# Patient Record
Sex: Male | Born: 1965 | Race: White | Hispanic: No | Marital: Married | Smoking: Current every day smoker
Health system: Southern US, Community
[De-identification: ages and names within clinical notes are randomized; demographics above are authoritative.]

## PROBLEM LIST (undated history)

## (undated) DIAGNOSIS — F32A Depression, unspecified: Secondary | ICD-10-CM

## (undated) DIAGNOSIS — F419 Anxiety disorder, unspecified: Secondary | ICD-10-CM

## (undated) DIAGNOSIS — F329 Major depressive disorder, single episode, unspecified: Secondary | ICD-10-CM

## (undated) DIAGNOSIS — G4730 Sleep apnea, unspecified: Secondary | ICD-10-CM

## (undated) DIAGNOSIS — I10 Essential (primary) hypertension: Secondary | ICD-10-CM

## (undated) DIAGNOSIS — H3552 Pigmentary retinal dystrophy: Secondary | ICD-10-CM

## (undated) DIAGNOSIS — H269 Unspecified cataract: Secondary | ICD-10-CM

## (undated) DIAGNOSIS — E7800 Pure hypercholesterolemia, unspecified: Secondary | ICD-10-CM

## (undated) HISTORY — DX: Unspecified cataract: H26.9

## (undated) HISTORY — DX: Sleep apnea, unspecified: G47.30

## (undated) HISTORY — DX: Pure hypercholesterolemia, unspecified: E78.00

## (undated) HISTORY — DX: Depression, unspecified: F32.A

## (undated) HISTORY — PX: EYE SURGERY: SHX253

## (undated) HISTORY — DX: Anxiety disorder, unspecified: F41.9

## (undated) HISTORY — DX: Essential (primary) hypertension: I10

## (undated) HISTORY — DX: Pigmentary retinal dystrophy: H35.52

## (undated) HISTORY — DX: Major depressive disorder, single episode, unspecified: F32.9

---

## 1999-01-28 ENCOUNTER — Emergency Department (HOSPITAL_COMMUNITY): Admission: EM | Admit: 1999-01-28 | Discharge: 1999-01-28 | Payer: Self-pay | Admitting: Internal Medicine

## 1999-10-18 ENCOUNTER — Encounter: Admission: RE | Admit: 1999-10-18 | Discharge: 1999-10-18 | Payer: Self-pay | Admitting: Otolaryngology

## 1999-10-18 ENCOUNTER — Encounter: Payer: Self-pay | Admitting: Otolaryngology

## 2000-10-20 ENCOUNTER — Emergency Department (HOSPITAL_COMMUNITY): Admission: EM | Admit: 2000-10-20 | Discharge: 2000-10-20 | Payer: Self-pay | Admitting: Internal Medicine

## 2001-12-01 ENCOUNTER — Emergency Department (HOSPITAL_COMMUNITY): Admission: EM | Admit: 2001-12-01 | Discharge: 2001-12-01 | Payer: Self-pay | Admitting: Emergency Medicine

## 2008-06-17 HISTORY — PX: HAND SURGERY: SHX662

## 2008-08-01 ENCOUNTER — Ambulatory Visit (HOSPITAL_COMMUNITY): Admission: EM | Admit: 2008-08-01 | Discharge: 2008-08-01 | Payer: Self-pay | Admitting: Emergency Medicine

## 2010-10-02 LAB — CBC
HCT: 43.6 % (ref 39.0–52.0)
Hemoglobin: 14.7 g/dL (ref 13.0–17.0)
MCHC: 33.8 g/dL (ref 30.0–36.0)
MCV: 91.6 fL (ref 78.0–100.0)
Platelets: 304 10*3/uL (ref 150–400)
RBC: 4.76 MIL/uL (ref 4.22–5.81)
RDW: 13.2 % (ref 11.5–15.5)
WBC: 13.6 10*3/uL — ABNORMAL HIGH (ref 4.0–10.5)

## 2010-10-02 LAB — COMPREHENSIVE METABOLIC PANEL
ALT: 18 U/L (ref 0–53)
AST: 19 U/L (ref 0–37)
Albumin: 3.5 g/dL (ref 3.5–5.2)
Alkaline Phosphatase: 76 U/L (ref 39–117)
BUN: 8 mg/dL (ref 6–23)
CO2: 23 mEq/L (ref 19–32)
Calcium: 8.9 mg/dL (ref 8.4–10.5)
Chloride: 108 mEq/L (ref 96–112)
Creatinine, Ser: 0.88 mg/dL (ref 0.4–1.5)
GFR calc Af Amer: >60 mL/min (ref >60)
GFR calc non Af Amer: >60 mL/min (ref >60)
Glucose, Bld: 120 mg/dL — ABNORMAL HIGH (ref 70–99)
Potassium: 3.6 mEq/L (ref 3.5–5.1)
Sodium: 140 mEq/L (ref 135–145)
Total Bilirubin: 0.7 mg/dL (ref 0.3–1.2)
Total Protein: 6.3 g/dL (ref 6.0–8.3)

## 2010-10-02 LAB — DIFFERENTIAL
Eosinophils Relative: 3 % (ref 0–5)
Lymphocytes Relative: 35 % (ref 12–46)
Lymphs Abs: 4.8 10*3/uL — ABNORMAL HIGH (ref 0.7–4.0)
Neutrophils Relative %: 54 % (ref 43–77)

## 2010-10-30 NOTE — Consult Note (Signed)
NAME:  HOBSON, LAX NO.:  [id number]   MEDICAL RECORD NO.:  [id number]          PATIENT TYPE:  EMS   LOCATION:  ED                           FACILITY:  [facility]   PHYSICIAN:  Artist Pais. Weingold, M.D.DATE OF BIRTH:  [birth year]   DATE OF CONSULTATION:  08/01/2008  DATE OF DISCHARGE:                                 CONSULTATION   PHYSICIAN REQUESTING CONSULTATION:  Rhae Lerner. Margretta Ditty, M.D.   REASON FOR CONSULTATION:  Ihsan Nomura is a 45 year old right-hand-  dominant male who unfortunately got his nondominant left hand caught in  a machine at work.  Presents today with complex laceration to his left  hand.  He is 45 years old.  He has no known drug allergies.  No current  medications.  No recent hospitalizations or surgery.   FAMILY MEDICAL HISTORY:  Noncontributory.   SOCIAL HISTORY:  Occasional alcohol and tobacco use.   PHYSICAL EXAMINATION:  GENERAL:  A well-developed, well-nourished male,  pleasant, alert and oriented x3.  EXAMINATION OF HIS HAND ON THE LEFT:  He has a complex laceration on the  volar aspect of his hand going from the index finger MP joint to the  ring finger MCP joint along the palmar creases.  He has the ability to  flex his fingers although it is painful.  He also has a laceration along  the radial side of the thumb from the nail bed and nail plate  intersection to the level just proximal to the IP joint and 2 large  dorsal lacerations over the long and ring metacarpals.  X-rays are  negative for fracture, dislocation.  He does have a fair amount of  swelling and bleeding.   ASSESSMENT:  A 45 year old male with complex lacerations, nondominant  left hand, status post industrial accident.   RECOMMENDATIONS:  At this point in time, we will take him to the  operating room for I and D, explore and repair as necessary complex  lacerations with possible tendon artery nerve damage left hand.      Artist Pais Mina Marble, M.D.  Electronically Signed     MAW/MEDQ  D:  08/01/2008  T:  08/02/2008  Job:  [id number]

## 2010-10-30 NOTE — Op Note (Signed)
NAME:  Manuel Lang, Manuel Lang NO.:  [id number]   MEDICAL RECORD NO.:  [id number]          PATIENT TYPE:  EMS   LOCATION:  ED                           FACILITY:  [facility]   PHYSICIAN:  Artist Pais. Weingold, M.D.DATE OF BIRTH:  Oct 07, 1965   DATE OF PROCEDURE:  08/01/2008  DATE OF DISCHARGE:                               OPERATIVE REPORT   PREOPERATIVE DIAGNOSIS:  Complex lacerations, left hand.   POSTOPERATIVE DIAGNOSIS:  Complex lacerations, left hand.   PROCEDURE:  Irrigation, debridement, exploration and repair of complex  laceration left hand.   SURGEON:  Artist Pais. Mina Marble, M.D.   ASSISTANT:  None.   ANESTHESIA:  General.   TOURNIQUET TIME:  38 minutes.   COMPLICATIONS:  None.   DRAINS:  None.   OPERATIVE REPORT:  The patient was taken to operating suite.  After  induction of adequate general anesthesia, left upper extremity was  prepped and draped in the usual sterile fashion.  Esmarch was used to  exsanguinate the limb.  Tourniquet was then inflated to 250 mmHg.  At  this point in time, exploration revealed a complex laceration from the  index finger metacarpophalangeal joint to the level of the ring finger  metacarpophalangeal joint volarly along the MP flexion creases, also a  complex laceration to the radial aspect of the thumb IP joint and two  large dorsal lacerations.  Exploration of the dorsal laceration revealed  intact tendon units, significant blood and soiling.  This was carefully  irrigated and debrided of all nonviable tissue then loosely closed with  3-0 and 4-0 Vicryl Rapide suture.  The hand was then fully supinated.  The radial side thumb laceration revealed intact radial neurovascular  bundle and intact flexor sheath.  This was also irrigated and loosely  closed with 4-0 Vicryl Rapide.  The palmar laceration was explored.  The  digital nerves to the index, long and ring finger were carefully  identified and deemed be intact.  There was  some stretching of the soft  tissues and tendons were intact as well as arteries and nerves.  These  wounds were irrigated and loosely closed with 4-0 Vicryl Rapide suture.  At the end of procedure, Xeroform, 4x4s and compressive wrap was applied  as well as a dorsal splint.  The patient tolerated well and went to  recovery room in stable fashion.     Artist Pais Mina Marble, M.D.  Electronically Signed    MAW/MEDQ  D:  08/01/2008  T:  08/02/2008  Job:  [id number]

## 2013-04-04 ENCOUNTER — Ambulatory Visit (HOSPITAL_COMMUNITY)
Admission: RE | Admit: 2013-04-04 | Discharge: 2013-04-04 | Disposition: A | Discharge status: Home / Self Care | Payer: BC Managed Care – PPO | Source: Ambulatory Visit | Attending: Emergency Medicine | Admitting: Emergency Medicine

## 2013-04-04 ENCOUNTER — Ambulatory Visit (INDEPENDENT_AMBULATORY_CARE_PROVIDER_SITE_OTHER): Payer: BC Managed Care – PPO | Admitting: Emergency Medicine

## 2013-04-04 VITALS — BP 124/76 | HR 90 | Temp 98.5°F | Resp 18 | Ht 66.0 in | Wt 168.0 lb

## 2013-04-04 DIAGNOSIS — R51 Headache: Secondary | ICD-10-CM | POA: Insufficient documentation

## 2013-04-04 DIAGNOSIS — R42 Dizziness and giddiness: Secondary | ICD-10-CM | POA: Insufficient documentation

## 2013-04-04 DIAGNOSIS — S0990XA Unspecified injury of head, initial encounter: Secondary | ICD-10-CM

## 2013-04-04 DIAGNOSIS — R270 Ataxia, unspecified: Secondary | ICD-10-CM

## 2013-04-04 DIAGNOSIS — Z9181 History of falling: Secondary | ICD-10-CM | POA: Insufficient documentation

## 2013-04-04 DIAGNOSIS — R279 Unspecified lack of coordination: Secondary | ICD-10-CM

## 2013-04-04 LAB — COMPREHENSIVE METABOLIC PANEL WITH GFR
ALT: 24 U/L (ref 0–53)
AST: 17 U/L (ref 0–37)
Albumin: 4.1 g/dL (ref 3.5–5.2)
Alkaline Phosphatase: 79 U/L (ref 39–117)
BUN: 10 mg/dL (ref 6–23)
CO2: 25 meq/L (ref 19–32)
Calcium: 9.6 mg/dL (ref 8.4–10.5)
Chloride: 108 meq/L (ref 96–112)
Creat: 0.84 mg/dL (ref 0.50–1.35)
Glucose, Bld: 94 mg/dL (ref 70–99)
Potassium: 4.2 meq/L (ref 3.5–5.3)
Sodium: 140 meq/L (ref 135–145)
Total Bilirubin: 0.4 mg/dL (ref 0.3–1.2)
Total Protein: 7.3 g/dL (ref 6.0–8.3)

## 2013-04-04 LAB — POCT CBC
Granulocyte percent: 69.4 % (ref 37–80)
HCT, POC: 51.3 % (ref 43.5–53.7)
Hemoglobin: 16.5 g/dL (ref 14.1–18.1)
Lymph, poc: 2.9 (ref 0.6–3.4)
MCH, POC: 30.7 pg (ref 27–31.2)
MCHC: 32.2 g/dL (ref 31.8–35.4)
MCV: 95.6 fL (ref 80–97)
MID (cbc): 0.8 (ref 0–0.9)
MPV: 8.7 fL (ref 0–99.8)
POC Granulocyte: 8.5 — AB (ref 2–6.9)
POC LYMPH PERCENT: 24 % (ref 10–50)
POC MID %: 6.6 % (ref 0–12)
Platelet Count, POC: 353 K/uL (ref 142–424)
RBC: 5.37 M/uL (ref 4.69–6.13)
RDW, POC: 14 %
WBC: 12.2 K/uL — AB (ref 4.6–10.2)

## 2013-04-04 MED ORDER — MECLIZINE HCL 50 MG PO TABS: 50.0000 mg | ORAL_TABLET | Freq: Three times a day (TID) | ORAL | Status: DC | PRN
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
## 2013-04-05 ENCOUNTER — Other Ambulatory Visit (HOSPITAL_COMMUNITY): Payer: Self-pay

## 2013-04-08 ENCOUNTER — Ambulatory Visit (INDEPENDENT_AMBULATORY_CARE_PROVIDER_SITE_OTHER): Payer: BC Managed Care – PPO | Admitting: Neurology

## 2013-04-08 ENCOUNTER — Encounter (INDEPENDENT_AMBULATORY_CARE_PROVIDER_SITE_OTHER): Payer: Self-pay

## 2013-04-08 ENCOUNTER — Encounter: Payer: Self-pay | Admitting: Neurology

## 2013-04-08 VITALS — BP 128/93 | HR 105 | Ht 66.0 in | Wt 167.0 lb

## 2013-04-08 DIAGNOSIS — F32A Depression, unspecified: Secondary | ICD-10-CM | POA: Insufficient documentation

## 2013-04-08 DIAGNOSIS — F329 Major depressive disorder, single episode, unspecified: Secondary | ICD-10-CM

## 2013-04-08 DIAGNOSIS — R42 Dizziness and giddiness: Secondary | ICD-10-CM | POA: Insufficient documentation

## 2013-04-08 MED ORDER — SERTRALINE HCL 50 MG PO TABS: 50.0000 mg | ORAL_TABLET | Freq: Every day | ORAL | Status: DC
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
## 2013-05-21 ENCOUNTER — Encounter: Payer: Self-pay | Admitting: Nurse Practitioner

## 2013-05-24 ENCOUNTER — Encounter: Payer: Self-pay | Admitting: Nurse Practitioner

## 2013-07-09 ENCOUNTER — Ambulatory Visit: Payer: BC Managed Care – PPO | Admitting: Nurse Practitioner

## 2013-11-09 ENCOUNTER — Ambulatory Visit (INDEPENDENT_AMBULATORY_CARE_PROVIDER_SITE_OTHER): Payer: BC Managed Care – PPO | Admitting: Internal Medicine

## 2013-11-09 ENCOUNTER — Ambulatory Visit: Payer: BC Managed Care – PPO

## 2013-11-09 VITALS — BP 140/88 | HR 66 | Temp 97.8°F | Resp 16 | Ht 65.0 in | Wt 169.2 lb

## 2013-11-09 DIAGNOSIS — L709 Acne, unspecified: Secondary | ICD-10-CM | POA: Insufficient documentation

## 2013-11-09 DIAGNOSIS — N200 Calculus of kidney: Secondary | ICD-10-CM

## 2013-11-09 DIAGNOSIS — L708 Other acne: Secondary | ICD-10-CM

## 2013-11-09 DIAGNOSIS — F17200 Nicotine dependence, unspecified, uncomplicated: Secondary | ICD-10-CM | POA: Insufficient documentation

## 2013-11-09 DIAGNOSIS — R319 Hematuria, unspecified: Secondary | ICD-10-CM

## 2013-11-09 DIAGNOSIS — R112 Nausea with vomiting, unspecified: Secondary | ICD-10-CM

## 2013-11-09 DIAGNOSIS — R109 Unspecified abdominal pain: Secondary | ICD-10-CM

## 2013-11-09 LAB — POCT CBC
Granulocyte percent: 84.6 %G — AB (ref 37–80)
HCT, POC: 48.8 % (ref 43.5–53.7)
HEMOGLOBIN: 15.5 g/dL (ref 14.1–18.1)
Lymph, poc: 2.1 (ref 0.6–3.4)
MCH, POC: 29.7 pg (ref 27–31.2)
MCHC: 31.8 g/dL (ref 31.8–35.4)
MCV: 93.4 fL (ref 80–97)
MID (cbc): 0.8 (ref 0–0.9)
POC GRANULOCYTE: 16.3 — AB (ref 2–6.9)
POC LYMPH PERCENT: 11.1 %L (ref 10–50)
POC MID %: 4.3 % (ref 0–12)
Platelet Count, POC: 9.2 10*3/uL — AB (ref 142–424)
RBC: 5.22 M/uL (ref 4.69–6.13)
RDW, POC: 13.9354 %
WBC: 19.3 10*3/uL — AB (ref 4.6–10.2)

## 2013-11-09 LAB — POCT UA - MICROSCOPIC ONLY
CRYSTALS, UR, HPF, POC: NEGATIVE
Casts, Ur, LPF, POC: NEGATIVE
EPITHELIAL CELLS, URINE PER MICROSCOPY: NEGATIVE
Mucus, UA: POSITIVE
YEAST UA: NEGATIVE

## 2013-11-09 LAB — POCT URINALYSIS DIPSTICK
Bilirubin, UA: NEGATIVE
Glucose, UA: NEGATIVE
Ketones, UA: NEGATIVE
NITRITE UA: NEGATIVE
PROTEIN UA: 30
UROBILINOGEN UA: 0.2
pH, UA: 6

## 2013-11-09 MED ORDER — DOXYCYCLINE HYCLATE 100 MG PO TABS: 100.0000 mg | ORAL_TABLET | Freq: Every day | ORAL | Status: DC

## 2013-11-09 MED ORDER — TAMSULOSIN HCL 0.4 MG PO CAPS: 0.4000 mg | ORAL_CAPSULE | Freq: Every day | ORAL | Status: DC

## 2013-11-09 MED ORDER — ONDANSETRON HCL 4 MG PO TABS: 4.0000 mg | ORAL_TABLET | Freq: Three times a day (TID) | ORAL | Status: DC | PRN

## 2013-11-09 MED ORDER — HYDROCODONE-ACETAMINOPHEN 5-325 MG PO TABS: 1.0000 | ORAL_TABLET | Freq: Four times a day (QID) | ORAL | Status: DC | PRN

## 2013-11-09 MED ORDER — ONDANSETRON 4 MG PO TBDP
4.0000 mg | ORAL_TABLET | Freq: Once | ORAL | Status: AC
  Administered 2013-11-09: 4 mg via ORAL

## 2013-11-09 MED ORDER — KETOROLAC TROMETHAMINE 60 MG/2ML IM SOLN
60.0000 mg | Freq: Once | INTRAMUSCULAR | Status: AC
  Administered 2013-11-09: 60 mg via INTRAMUSCULAR

## 2013-11-09 NOTE — Progress Notes (Signed)
Subjective:    Patient ID: Manuel Lang, male    DOB: [birth year], 48 y.o.   MRN: [id number]  HPI He has a six-hour history of waxing and waning right flank pain now extending a bit into his right middle quadrant of the abdomen Associated with nausea and vomiting when pain is severe No fever dysuria frequency or urgency and no hematuria No diarrhea No history of kidney stones No current medications  Also has a long history of facial acne and acne on the back usually controlled by doxycycline which he  like a refill   Review of Systems No fever chills or night sweats No chest pain No shortness of breath/he continues to smoke No sleep disorder    Objective:   Physical Exam BP 140/88  Pulse 66  Temp(Src) 97.8 F (36.6 C) (Oral)  Resp 16  Ht 5\' 5"  (1.651 m)  Wt 169 lb 3.2 oz (76.749 kg)  BMI 28.16 kg/m2  SpO2 99% HEENT clear Heart regular Abdomen supple without masses He is tender to percussion over the right flank Extremities are clear Skin reveals folliculitis on the back and facial acne, both mild but persistent  Results for orders placed in visit on 11/09/13  POCT URINALYSIS DIPSTICK      Result Value Ref Range   Color, UA yellow     Clarity, UA clear     Glucose, UA neg     Bilirubin, UA neg     Ketones, UA neg     Spec Grav, UA >=1.030     Blood, UA large     pH, UA 6.0     Protein, UA 30     Urobilinogen, UA 0.2     Nitrite, UA neg     Leukocytes, UA Trace    POCT UA - MICROSCOPIC ONLY      Result Value Ref Range   WBC, Ur, HPF, POC 0-3     RBC, urine, microscopic 15-TNTC     Bacteria, U Microscopic trace     Mucus, UA positive     Epithelial cells, urine per micros neg     Crystals, Ur, HPF, POC neg     Casts, Ur, LPF, POC neg     Yeast, UA neg    POCT CBC      Result Value Ref Range   WBC 19.3 (*) 4.6 - 10.2 K/uL   Lymph, poc 2.1  0.6 - 3.4   POC LYMPH PERCENT 11.1  10 - 50 %L   MID (cbc) 0.8  0 - 0.9   POC MID % 4.3  0 - 12 %M   POC  Granulocyte 16.3 (*) 2 - 6.9   Granulocyte percent 84.6 (*) 37 - 80 %G   RBC 5.22  4.69 - 6.13 M/uL   Hemoglobin 15.5  14.1 - 18.1 g/dL   HCT, POC 48.8  43.5 - 53.7 %   MCV 93.4  80 - 97 fL   MCH, POC 29.7  27 - 31.2 pg   MCHC 31.8  31.8 - 35.4 g/dL   RDW, POC 13.9354     Platelet Count, POC 9.2 (*) 142 - 424 K/uL   MPV    0 - 99.8 fL   [facility] reading (PRIMARY) by  Dr.Doolittle=no stone seen Toradol 60 IM given--symptom relief of painful at 25 minutes Nausea responding to Zofran      Assessment & Plan:  Rt flank pain -  Hematuria -  Nausea with vomiting  Nephrolithiasis---likely  diagnosis/first episode   If not resolved in 48 hours call for [place] to set up a CT urogram at urology Meds ordered this encounter  Medications  . ketorolac (TORADOL) injection 60 mg    Sig:   . ondansetron (ZOFRAN-ODT) disintegrating tablet 4 mg    Sig:   . ondansetron (ZOFRAN-ODT) disintegrating tablet 4 mg    Sig:                  . HYDROcodone-acetaminophen (NORCO/VICODIN) 5-325 MG per tablet    Sig: Take 1 tablet by mouth every 6 (six) hours as needed for moderate pain. Kidney stone    Dispense:  30 tablet    Refill:  0  . ondansetron (ZOFRAN) 4 MG tablet    Sig: Take 1 tablet (4 mg total) by mouth every 8 (eight) hours as needed for nausea or vomiting.    Dispense:  12 tablet    Refill:  0  . tamsulosin (FLOMAX) 0.4 MG CAPS capsule    Sig: Take 1 capsule (0.4 mg total) by mouth daily. to increase urinary flow    Dispense:  10 capsule    Refill:  0   Acne . doxycycline (VIBRA-TABS) 100 MG tablet    Sig: Take 1 tablet (100 mg total) by mouth daily. At hs    Dispense:  30 tablet    Refill:  5    Nicotine addiction  2 followup for CPE at 104 in 3 months and discuss this plus other preventive health issues

## 2013-11-10 LAB — COMPREHENSIVE METABOLIC PANEL
ALK PHOS: 69 U/L (ref 39–117)
ALT: 11 U/L (ref 0–53)
AST: 14 U/L (ref 0–37)
Albumin: 4 g/dL (ref 3.5–5.2)
BILIRUBIN TOTAL: 0.4 mg/dL (ref 0.2–1.2)
BUN: 12 mg/dL (ref 6–23)
CO2: 24 meq/L (ref 19–32)
Calcium: 9.5 mg/dL (ref 8.4–10.5)
Chloride: 100 mEq/L (ref 96–112)
Creat: 0.97 mg/dL (ref 0.50–1.35)
Glucose, Bld: 109 mg/dL — ABNORMAL HIGH (ref 70–99)
POTASSIUM: 4.5 meq/L (ref 3.5–5.3)
SODIUM: 132 meq/L — AB (ref 135–145)
TOTAL PROTEIN: 7 g/dL (ref 6.0–8.3)

## 2013-11-22 ENCOUNTER — Encounter: Payer: Self-pay | Admitting: Internal Medicine

## 2014-12-22 ENCOUNTER — Other Ambulatory Visit: Payer: Self-pay | Admitting: Internal Medicine

## 2015-05-25 ENCOUNTER — Ambulatory Visit (INDEPENDENT_AMBULATORY_CARE_PROVIDER_SITE_OTHER): Payer: BLUE CROSS/BLUE SHIELD | Admitting: Family Medicine

## 2015-05-25 VITALS — BP 130/80 | HR 84 | Temp 98.5°F | Resp 16 | Ht 64.0 in | Wt 162.2 lb

## 2015-05-25 DIAGNOSIS — G43109 Migraine with aura, not intractable, without status migrainosus: Secondary | ICD-10-CM | POA: Diagnosis not present

## 2015-05-25 DIAGNOSIS — F411 Generalized anxiety disorder: Secondary | ICD-10-CM | POA: Diagnosis not present

## 2015-05-25 MED ORDER — ALPRAZOLAM 0.25 MG PO TABS: 0.2500 mg | ORAL_TABLET | Freq: Every day | ORAL | Status: DC | PRN

## 2015-05-25 NOTE — Patient Instructions (Signed)

## 2015-05-25 NOTE — Progress Notes (Signed)
This is a 49 year old gentleman who works in Engineer, maintenance (IT). He comes in because of a mild headache on the left side. Headache was preceded by blurry vision lasted about one half hour and he describes as narrowing of visual fields. He's not had this before.  Patient is a smoker. He's had no chest pain or weakness in any of his arms. He's had no speech problem. He was not doing any new activity at the time this occurred. Visual changes have all resolved.  Patient also complains about significant anxiety. In the past she's taken Paxil but was able to wean himself off this 20 years ago. He denies any lack of interest in activities, weepy spells, suicidal ideation, or other symptoms of depression.  Objective: Middle-age man that has good eye contact and is appropriate BP 130/80 mmHg  Pulse 84  Temp(Src) 98.5 F (36.9 C) (Oral)  Resp 16  Ht 5\' 4"  (1.626 m)  Wt 162 lb 3.2 oz (73.573 kg)  BMI 27.83 kg/m2  SpO2 98% HEENT: Normal TMs, normal fundi, bilateral cataracts, edentulous upper plate, lower cavities, clear pharynx Neck: Supple no adenopathy or thyromegaly. No bruits Chest: Clear Heart: Faint 1/6 systolic ejection type murmur, no gallop, regular rhythm Reflexes: Symmetric Muscle strength: Symmetric Extremities: No edema, gait normal  Assessment: This gentleman has some anxiety. He describes a migraine syndrome with aura which was first experienced today.  Plan: Excedrin Migraine should the headache persist or recur. If symptoms become more regular, please return Alprazolam 0.25 one half tablet to 1 tablet every morning when necessary anxiety     ICD-9-CM ICD-10-CM   1. Migraine with aura and without status migrainosus, not intractable 346.00 G43.109 ALPRAZolam (XANAX) 0.25 MG tablet  2. Anxiety state 300.00 F41.1      Signed, Robyn Haber, MD

## 2015-09-10 ENCOUNTER — Ambulatory Visit (INDEPENDENT_AMBULATORY_CARE_PROVIDER_SITE_OTHER): Payer: BLUE CROSS/BLUE SHIELD | Admitting: Family Medicine

## 2015-09-10 VITALS — BP 130/80 | HR 76 | Temp 98.2°F | Resp 16 | Ht 65.0 in | Wt 167.0 lb

## 2015-09-10 DIAGNOSIS — Z7189 Other specified counseling: Secondary | ICD-10-CM | POA: Diagnosis not present

## 2015-09-10 DIAGNOSIS — Z23 Encounter for immunization: Secondary | ICD-10-CM | POA: Diagnosis not present

## 2015-09-10 DIAGNOSIS — Z7185 Encounter for immunization safety counseling: Secondary | ICD-10-CM

## 2015-09-10 DIAGNOSIS — R21 Rash and other nonspecific skin eruption: Secondary | ICD-10-CM | POA: Diagnosis not present

## 2015-09-10 MED ORDER — DOXYCYCLINE HYCLATE 100 MG PO TABS: 100.0000 mg | ORAL_TABLET | Freq: Two times a day (BID) | ORAL | Status: DC

## 2015-09-10 NOTE — Progress Notes (Signed)
By signing my name below, I, [name], attest that this documentation has been prepared under the direction and in the presence of Robyn Haber, MD. Electronically Signed: [name], [place]. 09/10/2015 , 11:41 AM .  Patient was seen in room 12 .   Patient ID: Manuel Lang MRN: [id number], DOB: 25-Feb-1966, 50 y.o. Date of Encounter: 09/10/2015  Primary Physician: Roselee Culver, MD  Chief Complaint:  Chief Complaint  Patient presents with   sores    arm, shoulders, and back of neck around hair line , x 1 year   Immunizations    tdap    HPI:  Manuel Lang is a 50 y.o. male who presents to [facility] complaining of sores over his left arm, shoulders and back of neck. He states that he initially noticed a sore on his left arm about 2 years ago. Now, he's noticed more over time and some on his scalp. He requests to have a tdap vaccine.   He works at [employer].   Past Medical History  Diagnosis Date   Depression      Home Meds: Prior to Admission medications   Medication Sig Start Date End Date Taking? Authorizing Provider  ALPRAZolam (XANAX) 0.25 MG tablet Take 1 tablet (0.25 mg total) by mouth daily as needed for anxiety. 05/25/15  Yes Robyn Haber, MD    Allergies: No Known Allergies  Social History   Social History   Marital Status: Married    Spouse Name: [name]   Number of Children: 3   Years of Education: N/A   Occupational History   Forensic scientist    Social History Main Topics   Smoking status: Current Every Day Smoker   Smokeless tobacco: Never Used   Alcohol Use: No   Drug Use: No   Sexual Activity: Not on file   Other Topics Concern   Not on file   Social History Narrative   Patient lives at home with his wife and children, 1 grandson.   Patient works at [employer], full-time.   Patient is married to [place].   Patient drinks 4-20oz of [facility].   Patient is right-handed.   Patient has 3 children.   Patient has a 7th grade education.     Review of Systems: Constitutional: negative for fever, chills, night sweats, weight changes, or fatigue  HEENT: negative for vision changes, hearing loss, congestion, rhinorrhea, ST, epistaxis, or sinus pressure Cardiovascular: negative for chest pain or palpitations Respiratory: negative for hemoptysis, wheezing, shortness of breath, or cough Abdominal: negative for abdominal pain, nausea, vomiting, diarrhea, or constipation Dermatological: negative for rash; positive for sores  Neurologic: negative for headache, dizziness, or syncope All other systems reviewed and are otherwise negative with the exception to those above and in the HPI.  Physical Exam:  Blood pressure 130/80, pulse 76, temperature 98.2 F (36.8 C), temperature source Oral, resp. rate 16, height 5\' 5"  (1.651 m), weight 167 lb (75.751 kg), SpO2 98 %., Body mass index is 27.79 kg/(m^2). General: Well developed, well nourished, in no acute distress. Head: Normocephalic, atraumatic, eyes without discharge, sclera non-icteric, nares are without discharge. Bilateral auditory canals clear, TM's are without perforation, pearly grey and translucent with reflective cone of light bilaterally. Oral cavity moist, posterior pharynx without exudate, erythema, peritonsillar abscess, or post nasal drip.  Neck: Supple. No thyromegaly. Full ROM. No lymphadenopathy. Lungs: Clear bilaterally to auscultation without wheezes, rales, or rhonchi. Breathing is unlabored. Heart: RRR with S1 S2.  No murmurs, rubs, or gallops appreciated. Msk:  Strength and tone normal for age. Extremities/Skin: Warm and dry. multiple scaly scab lesions over scalp and arms Neuro: Alert and oriented X 3. Moves all extremities spontaneously. Gait is normal. CNII-XII grossly in tact. Psych:  Responds to questions appropriately with a normal affect.   Labs:  ASSESSMENT AND PLAN:  50 y.o. year old male with  Rash and nonspecific skin eruption - Plan: doxycycline (VIBRA-TABS) 100 MG tablet  Immunization counseling - Plan: Tdap vaccine greater than or equal to 7yo IM  This chart was scribed in my presence and reviewed by me personally.     Signed, Robyn Haber, MD 09/10/2015 11:43 AM

## 2015-09-10 NOTE — Patient Instructions (Addendum)
Remember to wash the sheets. Let me know if the rash is not resolving over the next week.

## 2016-06-21 ENCOUNTER — Ambulatory Visit (INDEPENDENT_AMBULATORY_CARE_PROVIDER_SITE_OTHER): Payer: BLUE CROSS/BLUE SHIELD | Admitting: Emergency Medicine

## 2016-06-21 VITALS — BP 122/80 | HR 80 | Temp 99.0°F | Resp 16 | Ht 65.0 in | Wt 169.6 lb

## 2016-06-21 DIAGNOSIS — F411 Generalized anxiety disorder: Secondary | ICD-10-CM

## 2016-06-21 DIAGNOSIS — G43109 Migraine with aura, not intractable, without status migrainosus: Secondary | ICD-10-CM

## 2016-06-21 DIAGNOSIS — R21 Rash and other nonspecific skin eruption: Secondary | ICD-10-CM

## 2016-06-21 MED ORDER — DOXYCYCLINE HYCLATE 100 MG PO TABS: 100.0000 mg | ORAL_TABLET | Freq: Two times a day (BID) | ORAL | 0 refills | Status: DC

## 2016-06-21 MED ORDER — ALPRAZOLAM 0.25 MG PO TABS: 0.2500 mg | ORAL_TABLET | Freq: Every day | ORAL | 3 refills | Status: DC | PRN

## 2016-06-21 NOTE — Progress Notes (Signed)
Paulette Edgerly 51 y.o.   Chief Complaint  Patient presents with  . Medication Refill    xanax, doxycycline    HISTORY OF PRESENT ILLNESS: This is a 51 y.o. 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  HPI   Prior to Admission medications   Medication Sig Start Date End Date Taking? Authorizing Provider  ALPRAZolam (XANAX) 0.25 MG tablet Take 1 tablet (0.25 mg total) by mouth daily as needed for anxiety. 05/25/15  Yes Robyn Haber, MD  doxycycline (VIBRA-TABS) 100 MG tablet Take 1 tablet (100 mg total) by mouth 2 (two) times daily. 09/10/15  Yes Robyn Haber, MD    No Known Allergies  Patient Active Problem List   Diagnosis Date Noted  . Acne 11/09/2013  . Nephrolithiasis 11/09/2013  . Nicotine addiction 11/09/2013  . Depression 04/08/2013  . Vertigo 04/08/2013    Past Medical History:  Diagnosis Date  . Depression     Past Surgical History:  Procedure Laterality Date  . HAND SURGERY  2010   Left    Social History   Social History  . Marital status: Married    Spouse name: [name]  . Number of children: 3  . Years of education: N/A   Occupational History  . [employer] Proofreader   Social History Main Topics  . Smoking status: Current Every Day Smoker  . Smokeless tobacco: Never Used  . Alcohol use No  . Drug use: No  . Sexual activity: Not on file   Other Topics Concern  . Not on file   Social History Narrative   Patient lives at home with his wife and children, 1 grandson.   Patient works at [employer], full-time.   Patient is married to [place].   Patient drinks 4-20oz of [facility].   Patient is right-handed.   Patient has 3 children.   Patient has a 7th grade education.    History reviewed. No pertinent family history.   Review of Systems  Constitutional: Negative for chills  and fever.  HENT: Negative for congestion, ear discharge, ear pain, nosebleeds and sore throat.   Eyes: Negative for blurred vision, photophobia, pain, discharge and redness.  Respiratory: Negative for cough, hemoptysis, shortness of breath and wheezing.   Cardiovascular: Negative for chest pain, palpitations and leg swelling.  Gastrointestinal: Negative for abdominal pain, blood in stool, constipation, diarrhea, nausea and vomiting.  Genitourinary: Negative for dysuria and hematuria.  Musculoskeletal: Negative for back pain, myalgias and neck pain.  Skin: Positive for itching and rash.  Neurological: Positive for headaches (intermittent). Negative for dizziness.  Endo/Heme/Allergies: Negative.   Psychiatric/Behavioral: Negative.   All other systems reviewed and are negative.    Physical Exam  Constitutional: He is oriented to person, place, and time.  HENT:  Head: Normocephalic and atraumatic.  Nose: Nose normal.  Mouth/Throat: Oropharynx is clear and moist.  Eyes: Conjunctivae and EOM are normal. Pupils are equal, round, and reactive to light.  Neck: Normal range of motion. Neck supple.  Cardiovascular: Normal rate, regular rhythm, normal heart sounds and intact distal pulses.   Pulmonary/Chest: Effort normal and breath sounds normal.  Abdominal: Soft.  Musculoskeletal: Normal range of motion.  Neurological: He is alert and oriented to person, place, and time.  Skin: Skin is warm and dry. Capillary refill takes less than 2 seconds.  Psychiatric: He  has a normal mood and affect. His behavior is normal.  Vitals reviewed.    ASSESSMENT & PLAN: Blaze was seen today for medication refill.  Diagnoses and all orders for this visit:  Anxiety state  Rash and nonspecific skin eruption -     doxycycline (VIBRA-TABS) 100 MG tablet; Take 1 tablet (100 mg total) by mouth 2 (two) times daily.  Migraine with aura and without status migrainosus, not intractable -     ALPRAZolam (XANAX)  0.25 MG tablet; Take 1 tablet (0.25 mg total) by mouth daily as needed for anxiety.    Patient Instructions       IF you received an x-ray today, you will receive an invoice from [facility]. Please contact [facility] at [phone] with questions or concerns regarding your invoice.   IF you received labwork today, you will receive an invoice from [facility]. Please contact [facility] at [phone] with questions or concerns regarding your invoice.   Our billing staff will not be able to assist you with questions regarding bills from these companies.  You will be contacted with the lab results as soon as they are available. The fastest way to get your results is to activate your My Chart account. Instructions are located on the last page of this paperwork. If you have not heard from [place] regarding the results in 2 weeks, please contact this office.      Contact Dermatitis Introduction Dermatitis is redness, soreness, and swelling (inflammation) of the skin. Contact dermatitis is a reaction to certain substances that touch the skin. You either touched something that irritated your skin, or you have allergies to something you touched. Follow these instructions at home: [place] your skin as needed.  Apply cool compresses to the affected areas.  Try taking a bath with:  Epsom salts. Follow the instructions on the package. You can get these at a pharmacy or grocery store.  Baking soda. Pour a small amount into the bath as told by your doctor.  Colloidal oatmeal. Follow the instructions on the package. You can get this at a pharmacy or grocery store.  Try applying baking soda paste to your skin. Stir water into baking soda until it looks like paste.  Do not scratch your skin.  Bathe less often.  Bathe in lukewarm water. Avoid using hot water. Medicines  Take or apply over-the-counter and prescription medicines only as told by your doctor.  If  you were prescribed an antibiotic medicine, take or apply your antibiotic as told by your doctor. Do not stop taking the antibiotic even if your condition starts to get better. General instructions  Keep all follow-up visits as told by your doctor. This is important.  Avoid the substance that caused your reaction. If you do not know what caused it, keep a journal to try to track what caused it. Write down:  What you eat.  What cosmetic products you use.  What you drink.  What you wear in the affected area. This includes jewelry.  If you were given a bandage (dressing), take care of it as told by your doctor. This includes when to change and remove it. Contact a doctor if:  You do not get better with treatment.  Your condition gets worse.  You have signs of infection such as:  Swelling.  Tenderness.  Redness.  Soreness.  Warmth.  You have a fever.  You have new symptoms. Get help right away if:  You have a very bad headache.  You have neck pain.  Your neck is stiff.  You throw up (vomit).  You feel very sleepy.  You see red streaks coming from the affected area.  Your bone or joint underneath the affected area becomes painful after the skin has healed.  The affected area turns darker.  You have trouble breathing. This information is not intended to replace advice given to you by your health care provider. Make sure you discuss any questions you have with your health care provider. Document Released: 03/31/2009 Document Revised: 11/09/2015 Document Reviewed: 10/19/2014  2017 Elsevier  Infeccin por estafilococo (Staphylococcal Infection) QU ES UNA INFECCIN POR ESTAFILOCOCO? Staphylococcus aureus, tambin conocido como estafilococo, es una bacteria que puede provocar infecciones. El tipo de infeccin por estafilococo ms frecuente es una infeccin de la piel. El estafilococo se puede contagiar fcilmente de Ardelia Mems persona a [place]. CMO SE DIAGNOSTICA UNA  INFECCIN POR ESTAFILOCOCO? El mdico puede diagnosticar y tratar la infeccin por estafilococo mediante un examen. Tambin puede realizar un cultivo de la zona afectada:  Para averiguar exactamente qu tipo de bacteria est causando la infeccin.  Para decidir qu medicamento es el ms apropiado para tratarla. CMO SE TRATA UNA INFECCIN POR ESTAFILOCOCO? [place] infecciones por estafilococo se tratan fcilmente con antibiticos. Algunas infecciones deben drenarse. Los tipos ms graves de estafilococo, que incluyen el Staphylococcus aureus resistente a la meticilina (SARM) pueden ser ms difciles de tratar. Pueden requerir un antibitico fuerte o ms de un antibitico. QU DEBO HACER EN EL HOGAR?  Tome los antibiticos como se lo haya indicado el mdico. Termine los antibiticos aunque comience a sentirse mejor.  Concurra a todas las visitas de control como se lo haya indicado el mdico.  Infrmeles a todos los mdicos, incluidos los dentistas, que tiene una infeccin por estafilococo, especialmente si le diagnosticaron SARM.  Pregntele al mdico cundo es seguro volver a la escuela o al [place].  Para evitar la diseminacin de la infeccin:  [place] infecciones y el pus o las secreciones cubiertos con vendas limpias y secas (vendaje), o como se lo haya indicado el [place] manos con agua y Medical laboratory scientific officer peridicamente, en especial despus de cambiar apsitos o tocarse la infeccin.  Recomindeles a sus familiares y [employer] se laven las manos frecuentemente con agua y [place]. Siempre se deben lavar las manos despus de [employer] apsitos, tocar la zona infectada o tocar los materiales infectados.  Evite compartir elementos personales que pueden haber estado en contacto con su infeccin. Estos incluyen toallas, toallitas de [place], [place], ropa y uniformes.  Lave sus prendas y ropa blanca con agua caliente y detergente para ropa. Secar la ropa con aire  caliente, en lugar de secarla al aire, tambin contribuye a matar las bacterias en la ropa. Magazine? Aunque suelen ser fciles de tratar, algunas infecciones por estafilococos pueden ser [employer]. Debe comunicarse con su mdico si la infeccin empeora o tiene fiebre. Esta informacin no tiene Marine scientist el consejo del mdico. Asegrese de hacerle al mdico cualquier pregunta que tenga. Document Released: 06/24/2014 Document Revised: 09/25/2015 Document Reviewed: 03/08/2014 Elsevier Interactive Patient Education  2017 Elsevier Inc.         Agustina Caroli, MD [facility]

## 2016-06-21 NOTE — Patient Instructions (Addendum)
IF you received an x-ray today, you will receive an invoice from [facility]. Please contact [facility] at [phone] with questions or concerns regarding your invoice.   IF you received labwork today, you will receive an invoice from [place]. Please contact [facility] at [phone] with questions or concerns regarding your invoice.   Our billing staff will not be able to assist you with questions regarding bills from these companies.  You will be contacted with the lab results as soon as they are available. The fastest way to get your results is to activate your My Chart account. Instructions are located on the last page of this paperwork. If you have not heard from [place] regarding the results in 2 weeks, please contact this office.      Contact Dermatitis Introduction Dermatitis is redness, soreness, and swelling (inflammation) of the skin. Contact dermatitis is a reaction to certain substances that touch the skin. You either touched something that irritated your skin, or you have allergies to something you touched. Follow these instructions at home: [place] your skin as needed.  Apply cool compresses to the affected areas.  Try taking a bath with:  Epsom salts. Follow the instructions on the package. You can get these at a pharmacy or grocery store.  Baking soda. Pour a small amount into the bath as told by your doctor.  Colloidal oatmeal. Follow the instructions on the package. You can get this at a pharmacy or grocery store.  Try applying baking soda paste to your skin. Stir water into baking soda until it looks like paste.  Do not scratch your skin.  Bathe less often.  Bathe in lukewarm water. Avoid using hot water. Medicines  Take or apply over-the-counter and prescription medicines only as told by your doctor.  If you were prescribed an antibiotic medicine, take or apply your antibiotic as told by your doctor. Do not stop taking  the antibiotic even if your condition starts to get better. General instructions  Keep all follow-up visits as told by your doctor. This is important.  Avoid the substance that caused your reaction. If you do not know what caused it, keep a journal to try to track what caused it. Write down:  What you eat.  What cosmetic products you use.  What you drink.  What you wear in the affected area. This includes jewelry.  If you were given a bandage (dressing), take care of it as told by your doctor. This includes when to change and remove it. Contact a doctor if:  You do not get better with treatment.  Your condition gets worse.  You have signs of infection such as:  Swelling.  Tenderness.  Redness.  Soreness.  Warmth.  You have a fever.  You have new symptoms. Get help right away if:  You have a very bad headache.  You have neck pain.  Your neck is stiff.  You throw up (vomit).  You feel very sleepy.  You see red streaks coming from the affected area.  Your bone or joint underneath the affected area becomes painful after the skin has healed.  The affected area turns darker.  You have trouble breathing. This information is not intended to replace advice given to you by your health care provider. Make sure you discuss any questions you have with your health care provider. Document Released: 03/31/2009 Document Revised: 11/09/2015 Document Reviewed: 10/19/2014  2017 Elsevier  MRSA FAQs What is MRSA? Staphylococcus aureus (pronounced  staff-ill-oh-KOK-us AW-ree-us), or "Staph" is a very common germ that about 1 out of every 3 people have on their skin or in their nose. This germ does not cause any problems for most people who have it on their skin. But sometimes it can cause serious infections such as skin or wound infections, pneumonia, or infections of the blood. Antibiotics are given to kill Staph germs when they cause infections. Some Staph are resistant,  meaning they cannot be killed by some antibiotics. "Methicillin-resistant Staphylococcus aureus" or "MRSA" is a type of Staph that is resistant to some of the antibiotics that are often used to treat Staph infections. Who is most likely to get an MRSA infection? In the hospital, people who are more likely to get an MRSA infection are people who:  have other health conditions making them sick  have been in the hospital or a nursing home  have been treated with antibiotics. People who are healthy and who have not been in the hospital or a nursing home can also get MRSA infections. These infections usually involve the skin. More information about this type of MRSA infection, known as "community-associated MRSA" infection, is available from the Centers for Disease Control and Prevention (CDC). http://rios.biz/ How do I get an MRSA infection? People who have MRSA germs on their skin or who are infected with MRSA may be able to spread the germ to other people. MRSA can be passed on to bed linens, bed rails, bathroom fixtures, and medical equipment. It can spread to other people on contaminated equipment and on the hands of doctors, nurses, other healthcare providers and visitors. Can MRSA infections be treated? Yes, there are antibiotics that can kill MRSA germs. Some patients with MRSA abscesses may need surgery to drain the infection. Your healthcare provider will determine which treatments are best for you. What are some of the things that hospitals are doing to prevent MRSA infections? To prevent MRSA infections, doctors, nurses and other healthcare providers:  Clean their hands with soap and water or an alcohol-based hand rub before and after caring for every patient.  Carefully clean hospital rooms and medical equipment.  Use Contact Precautions when caring for patients with MRSA. Contact Precautions mean:  Whenever possible, patients with MRSA will have a single room or will share a  room only with someone else who also has MRSA.  Healthcare providers will put on gloves and wear a gown over their clothing while taking care of patients with MRSA.  Visitors may also be asked to wear a gown and gloves.  When leaving the room, hospital providers and visitors remove their gown and gloves and clean their hands.  Patients on Contact Precautions are asked to stay in their hospital rooms as much as possible. They should not go to common areas, such as the gift shop or cafeteria. They may go to other areas of the hospital for treatments and tests.  May test some patients to see if they have MRSA on their skin. This test involves rubbing a cotton-tipped swab in the patient's nostrils or on the skin. What can I do to help prevent MRSA infections? In the hospital  Make sure that all doctors, nurses, and other healthcare providers clean their hands with soap and water or an alcohol-based hand rub before and after caring for you. If you do not see your providers clean their hands, please ask them to do so. When you go home  If you have wounds or an intravascular device (such  as a catheter or dialysis port) make sure that you know how to take care of them. Can my friends and family get MRSA when they visit me? The chance of getting MRSA while visiting a person who has MRSA is very low. To decrease the chance of getting MRSA your family and friends should:  Clean their hands before they enter your room and when they leave.  Ask a healthcare provider if they need to wear protective gowns and gloves when they visit you. What do I need to do when I go home from the hospital? To prevent another MRSA infection and to prevent spreading MRSA to others:  Keep taking any antibiotics prescribed by your doctor. Don't take half-doses or stop before you complete your prescribed course.  Clean your hands often, especially before and after changing your wound dressing or bandage.  People who  live with you should clean their hands often as well.  Keep any wounds clean and change bandages as instructed until healed.  Avoid sharing personal items such as towels or razors.  Wash and dry your clothes and bed linens in the warmest temperatures recommended on the labels.  Tell your healthcare providers that you have MRSA. This includes home health nurses and aides, therapists, and personnel in doctors' offices.  Your doctor may have more instructions for you. If you have questions, please ask your doctor or nurse.  Developed and co-sponsored by [employer] for [place] [id number]); Infectious Diseases Society of [place] (IDSA); [place]; Association for Professionals in Infection Control and Epidemiology (APIC); Centers for Disease Control and Prevention (CDC); and [employer].  This information is not intended to replace advice given to you by your health care provider. Make sure you discuss any questions you have with your health care provider. Document Released: 06/08/2013 Document Revised: 11/09/2015 Document Reviewed: 08/17/2014 Elsevier Interactive Patient Education  2017 [employer].

## 2016-10-30 ENCOUNTER — Encounter (HOSPITAL_COMMUNITY): Payer: Self-pay | Admitting: *Deleted

## 2016-10-30 ENCOUNTER — Ambulatory Visit (HOSPITAL_COMMUNITY)
Admission: EM | Admit: 2016-10-30 | Discharge: 2016-10-30 | Disposition: A | Discharge status: Home / Self Care | Payer: BLUE CROSS/BLUE SHIELD | Attending: Family Medicine | Admitting: Family Medicine

## 2016-10-30 DIAGNOSIS — M76899 Other specified enthesopathies of unspecified lower limb, excluding foot: Secondary | ICD-10-CM

## 2016-10-30 DIAGNOSIS — M76891 Other specified enthesopathies of right lower limb, excluding foot: Secondary | ICD-10-CM | POA: Diagnosis not present

## 2016-10-30 DIAGNOSIS — S76911A Strain of unspecified muscles, fascia and tendons at thigh level, right thigh, initial encounter: Secondary | ICD-10-CM | POA: Diagnosis not present

## 2016-10-30 NOTE — ED Triage Notes (Signed)
Pain  r  Knee    X   2.5  Weeks   Denies   Injury   Reports  Symptoms  Getting   Worse  Pain is  Radiating   Down  lef  From  His  r  Hip

## 2016-10-30 NOTE — ED Provider Notes (Signed)
CSN: [id number]     Arrival date & time 10/30/16  1743 History   First MD Initiated Contact with Patient 10/30/16 1904     Chief Complaint  Patient presents with  . Knee Pain   (Consider location/radiation/quality/duration/timing/severity/associated sxs/prior Treatment) 51 year old male complaining of right leg pain. Primarily the pain is located to the right anterior thigh over the quadricep musculature. Pain is also located at the hamstring tendons in the posterior knee. 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 He has 2 jobs and works several hours during the day. No known injury. No fall or blunt trauma, no twisting of the knee. He denies pain within the knee joint itself.      Past Medical History:  Diagnosis Date  . Depression    Past Surgical History:  Procedure Laterality Date  . HAND SURGERY  2010   Left   History reviewed. No pertinent family history. Social History  Substance Use Topics  . Smoking status: Current Every Day Smoker  . Smokeless tobacco: Never Used  . Alcohol use No    Review of Systems  Constitutional: Positive for activity change. Negative for fever.  Respiratory: Negative.   Gastrointestinal: Negative.   Genitourinary: Negative.   Musculoskeletal:       As per HPI  Skin: Negative.   Neurological: Negative for dizziness, weakness, numbness and headaches.  All other systems reviewed and are negative.   Allergies  Patient has no known allergies.  Home Medications   Prior to Admission medications   Medication Sig Start Date End Date Taking? Authorizing Provider  ALPRAZolam (XANAX) 0.25 MG tablet Take 1 tablet (0.25 mg total) by mouth daily as needed for anxiety. 06/21/16   Horald Pollen, MD  doxycycline (VIBRA-TABS) 100 MG tablet Take 1 tablet (100 mg total) by mouth 2 (two) times daily. 06/21/16    Horald Pollen, MD   Meds Ordered and Administered this Visit  Medications - No data to display  BP (!) 150/80 (BP Location: Right Arm)   Pulse 82   Temp 98 F (36.7 C) (Oral)   Resp 18   SpO2 99%  No data found.   Physical Exam  Constitutional: He is oriented to person, place, and time. He appears well-developed and well-nourished.  HENT:  Head: Normocephalic and atraumatic.  Eyes: EOM are normal. Left eye exhibits no discharge.  Neck: Normal range of motion. Neck supple.  Musculoskeletal: Normal range of motion. He exhibits tenderness. He exhibits no edema or deformity.  No apparent swelling but positive for tenderness over the quadricep muscles and associated tendons. Tenderness over the hamstring tendons posteriorly. Hyper flexion of the knee produces pain and tenderness to the quadricep muscles and extension produces pain to the hamstring tendons. No direct knee tenderness. No tenderness of the patella or patellar ligament. No tenderness to the femoral or tibial condyles. Full range of motion of the knee. No laxity appreciated, negative drawer, negative varus, negative out of this.  Neurological: He is alert and oriented to person, place, and time. No cranial nerve deficit.  Skin: Skin is warm and dry.  Psychiatric: He has a normal mood and affect.    Urgent Care Course     Procedures (including critical care time)  Labs Review Labs Reviewed - No data to display  Imaging Review No results found.   Visual  Acuity Review  Right Eye Distance:   Left Eye Distance:   Bilateral Distance:    Right Eye Near:   Left Eye Near:    Bilateral Near:         MDM   1. Quadriceps tendinitis   2. Muscle strain of right thigh, initial encounter   3. Hamstring tendinitis of right thigh    The pain is originating in the muscles and tendons that allow you to climb stairs and ambulate. The amount of work in the amount of time-year-old working is likely the reason that  you are having this pain. You need to reduce some of the work that you are doing with her right leg. Apply ice to the areas of pain often known, limit your work and use of the right leg especially with climbing and pushing upward with the leg. May take ibuprofen 600 mg every 6 hours. You may try placing a ace wrap over the thigh muscles. This may or may not help.    Janne Napoleon, NP 10/30/16 1910

## 2016-10-30 NOTE — Discharge Instructions (Signed)
The pain is originating in the muscles and tendons that allow you to climb stairs and ambulate. The amount of work in the amount of time-year-old working is likely the reason that you are having this pain. You need to reduce some of the work that you are doing with her right leg. Apply ice to the areas of pain often known, limit your work and use of the right leg especially with climbing and pushing upward with the leg. May take ibuprofen 600 mg every 6 hours. You may try placing a ace wrap over the thigh muscles. This may or may not help.

## 2017-12-31 ENCOUNTER — Ambulatory Visit (INDEPENDENT_AMBULATORY_CARE_PROVIDER_SITE_OTHER): Payer: PRIVATE HEALTH INSURANCE | Admitting: Emergency Medicine

## 2017-12-31 ENCOUNTER — Encounter: Payer: Self-pay | Admitting: Emergency Medicine

## 2017-12-31 VITALS — BP 150/101 | HR 89 | Temp 98.1°F | Resp 16 | Ht 65.0 in | Wt 170.0 lb

## 2017-12-31 DIAGNOSIS — F419 Anxiety disorder, unspecified: Secondary | ICD-10-CM | POA: Diagnosis not present

## 2017-12-31 DIAGNOSIS — R21 Rash and other nonspecific skin eruption: Secondary | ICD-10-CM | POA: Diagnosis not present

## 2017-12-31 DIAGNOSIS — F329 Major depressive disorder, single episode, unspecified: Secondary | ICD-10-CM | POA: Diagnosis not present

## 2017-12-31 MED ORDER — BUPROPION HCL ER (XL) 150 MG PO TB24: 150.0000 mg | ORAL_TABLET | Freq: Every day | ORAL | 3 refills | Status: DC

## 2017-12-31 MED ORDER — DOXYCYCLINE HYCLATE 100 MG PO TABS: 100.0000 mg | ORAL_TABLET | Freq: Two times a day (BID) | ORAL | 1 refills | Status: DC

## 2017-12-31 NOTE — Patient Instructions (Addendum)
IF you received an x-ray today, you will receive an invoice from [facility]. Please contact [facility] at [phone] with questions or concerns regarding your invoice.   IF you received labwork today, you will receive an invoice from [place]. Please contact [facility] at [phone] with questions or concerns regarding your invoice.   Our billing staff will not be able to assist you with questions regarding bills from these companies.  You will be contacted with the lab results as soon as they are available. The fastest way to get your results is to activate your My Chart account. Instructions are located on the last page of this paperwork. If you have not heard from [place] regarding the results in 2 weeks, please contact this office.      Major Depressive Disorder, Adult Major depressive disorder (MDD) is a mental health condition. MDD often makes you feel sad, hopeless, or helpless. MDD can also cause symptoms in your body. MDD can affect your:  Work.  School.  Relationships.  Other normal activities.  MDD can range from mild to very bad. It may occur once (single episode MDD). It can also occur many times (recurrent MDD). The main symptoms of MDD often include:  Feeling sad, depressed, or irritable most of the time.  Loss of interest.  MDD symptoms also include:  Sleeping too much or too little.  Eating too much or too little.  A change in your weight.  Feeling tired (fatigue) or having low energy.  Feeling worthless.  Feeling guilty.  Trouble making decisions.  Trouble thinking clearly.  Thoughts of suicide or harming others.  Feeling weak.  Feeling agitated.  Keeping yourself from being around other people (isolation).  Follow these instructions at home: Activity  Do these things as told by your doctor: ? Go back to your normal activities. ? Exercise regularly. ? Spend time outdoors. Alcohol  Talk with your doctor about  how alcohol can affect your antidepressant medicines.  Do not drink alcohol. Or, limit how much alcohol you drink. ? This means no more than 1 drink a day for nonpregnant women and 2 drinks a day for men. One drink equals one of these:  12 oz of beer.  5 oz of wine.  1 oz of hard liquor. General instructions  Take over-the-counter and prescription medicines only as told by your doctor.  Eat a healthy diet.  Get plenty of sleep.  Find activities that you enjoy. Make time to do them.  Think about joining a support group. Your doctor may be able to suggest a group for you.  Keep all follow-up visits as told by your doctor. This is important. Where to find more information:  [employer] on Mental Illness: ? www.nami.[place]: ? https://carter.com/  National Suicide Prevention Lifeline: ? 870-745-3950. This is free, 24-hour help. Contact a doctor if:  Your symptoms get worse.  You have new symptoms. Get help right away if:  You self-harm.  You see, hear, taste, smell, or feel things that are not present (hallucinate). If you ever feel like you may hurt yourself or others, or have thoughts about taking your own life, get help right away. You can go to your nearest emergency department or call:  Your local emergency services (911 in the [place]  A suicide crisis helpline, such as the National Suicide Prevention Lifeline: ? (229) 737-3232. This is open 24 hours a day.  This information is not intended to replace  advice given to you by your health care provider. Make sure you discuss any questions you have with your health care provider. Document Released: 05/15/2015 Document Revised: 02/18/2016 Document Reviewed: 02/18/2016 Elsevier Interactive Patient Education  2017 [employer].

## 2017-12-31 NOTE — Progress Notes (Signed)
Depression screen [facility] 2/9 12/31/2017 06/21/2016 09/10/2015 05/25/2015  Decreased Interest 2 0 0 0  Down, Depressed, Hopeless 2 0 0 0  PHQ - 2 Score 4 0 0 0  Altered sleeping 0 - - -  Tired, decreased energy 2 - - -  Change in appetite 2 - - -  Feeling bad or failure about yourself  2 - - -  Trouble concentrating 2 - - -  Moving slowly or fidgety/restless 2 - - -  Suicidal thoughts 0 - - -  PHQ-9 Score 14 - - -  Difficult doing work/chores Somewhat difficult - - -   Manuel Lang 52 y.o.   Chief Complaint  Patient presents with  . Follow-up    depression     HISTORY OF PRESENT ILLNESS: This is a 52 y.o. male complaining of depression.  Has a chronic history of depression going back to when he was in his 35s.  At some point he took Wellbutrin and it helped a great deal.  Would like to get back on it.  Chronic smoker.  No chronic medical problems.  No EtOH abuse.  On no chronic medications.  HPI   Prior to Admission medications   Medication Sig Start Date End Date Taking? Authorizing Provider  ALPRAZolam (XANAX) 0.25 MG tablet Take 1 tablet (0.25 mg total) by mouth daily as needed for anxiety. 06/21/16  Yes SagardiaInes Bloomer, MD    No Known Allergies  Patient Active Problem List   Diagnosis Date Noted  . Anxiety state 06/21/2016  . Rash and nonspecific skin eruption 06/21/2016  . Migraine with aura and without status migrainosus, not intractable 06/21/2016  . Acne 11/09/2013  . Nephrolithiasis 11/09/2013  . Nicotine addiction 11/09/2013  . Depression 04/08/2013  . Vertigo 04/08/2013    Past Medical History:  Diagnosis Date  . Depression     Past Surgical History:  Procedure Laterality Date  . HAND SURGERY  2010   Left    Social History   Socioeconomic History  . Marital status: Married    Spouse name: [name]  . Number of children: 3  . Years of education: Not on file  . Highest education level: Not on file  Occupational History  . Occupation: Office manager: [employer]  Social Needs  . Financial resource strain: Not on file  . Food insecurity:    Worry: Not on file    Inability: Not on file  . Transportation needs:    Medical: Not on file    Non-medical: Not on file  Tobacco Use  . Smoking status: Current Every Day Smoker  . Smokeless tobacco: Never Used  Substance and Sexual Activity  . Alcohol use: No  . Drug use: No  . Sexual activity: Not on file  Lifestyle  . Physical activity:    Days per week: Not on file    Minutes per session: Not on file  . Stress: Not on file  Relationships  . Social connections:    Talks on phone: Not on file    Gets together: Not on file    Attends religious service: Not on file    Active member of club or organization: Not on file    Attends meetings of clubs or organizations: Not on file    Relationship status: Not on file  . Intimate partner violence:    Fear of current or ex partner: Not on file    Emotionally abused: Not on file  Physically abused: Not on file    Forced sexual activity: Not on file  Other Topics Concern  . Not on file  Social History Narrative   Patient lives at home with his wife and children, 1 grandson.   Patient works at [employer], full-time.   Patient is married to [place].   Patient drinks 4-20oz of [facility].   Patient is right-handed.   Patient has 3 children.   Patient has a 7th grade education.    No family history on file.   Review of Systems  Constitutional: Negative.  Negative for chills, fever and weight loss.  HENT: Negative.  Negative for congestion, hearing loss, nosebleeds and sore throat.   Eyes: Negative.  Negative for blurred vision and double vision.  Respiratory: Negative.  Negative for cough and shortness of breath.   Cardiovascular: Negative.  Negative for chest pain and palpitations.  Gastrointestinal: Negative.  Negative for abdominal pain, diarrhea, heartburn, nausea and vomiting.    Genitourinary: Negative.  Negative for dysuria and hematuria.  Musculoskeletal: Negative.  Negative for back pain, myalgias and neck pain.  Skin: Positive for rash (Chronic and intermittent.  States doxycycline helps.  Requesting prescription for as needed use.).  Neurological: Negative.  Negative for dizziness and headaches.  Endo/Heme/Allergies: Negative.   Psychiatric/Behavioral: Positive for depression. Negative for substance abuse and suicidal ideas. The patient is nervous/anxious.   All other systems reviewed and are negative.  Vitals:   12/31/17 1539  BP: (!) 150/101  Pulse: 89  Resp: 16  Temp: 98.1 F (36.7 C)  SpO2: 98%     Physical Exam  Constitutional: He is oriented to person, place, and time. He appears well-developed and well-nourished.  HENT:  Head: Normocephalic and atraumatic.  Nose: Nose normal.  Mouth/Throat: Oropharynx is clear and moist.  Eyes: Pupils are equal, round, and reactive to light. Conjunctivae and EOM are normal.  Neck: Normal range of motion. Neck supple. No JVD present. No thyromegaly present.  Cardiovascular: Normal rate, regular rhythm and normal heart sounds.  Pulmonary/Chest: Effort normal and breath sounds normal.  Abdominal: Soft. Bowel sounds are normal. He exhibits no distension. There is no tenderness.  Musculoskeletal: Normal range of motion. He exhibits no edema or tenderness.  Lymphadenopathy:    He has no cervical adenopathy.  Neurological: He is alert and oriented to person, place, and time. No sensory deficit. He exhibits normal muscle tone. Coordination normal.  Skin: Skin is warm and dry. Capillary refill takes less than 2 seconds. No rash noted.  Psychiatric: He has a normal mood and affect. His behavior is normal.  Vitals reviewed.    ASSESSMENT & PLAN: Kashtyn was seen today for follow-up.  Diagnoses and all orders for this visit:  Anxiety and depression -     buPROPion (WELLBUTRIN XL) 150 MG 24 hr tablet; Take 1  tablet (150 mg total) by mouth daily.  Rash and nonspecific skin eruption -     doxycycline (VIBRA-TABS) 100 MG tablet; Take 1 tablet (100 mg total) by mouth 2 (two) times daily.    Patient Instructions       IF you received an x-ray today, you will receive an invoice from [facility]. Please contact [facility] at [phone] with questions or concerns regarding your invoice.   IF you received labwork today, you will receive an invoice from [place]. Please contact [facility] at [phone] with questions or concerns regarding your invoice.   Our billing staff will not be able to assist you  with questions regarding bills from these companies.  You will be contacted with the lab results as soon as they are available. The fastest way to get your results is to activate your My Chart account. Instructions are located on the last page of this paperwork. If you have not heard from [place] regarding the results in 2 weeks, please contact this office.      Major Depressive Disorder, Adult Major depressive disorder (MDD) is a mental health condition. MDD often makes you feel sad, hopeless, or helpless. MDD can also cause symptoms in your body. MDD can affect your:  Work.  School.  Relationships.  Other normal activities.  MDD can range from mild to very bad. It may occur once (single episode MDD). It can also occur many times (recurrent MDD). The main symptoms of MDD often include:  Feeling sad, depressed, or irritable most of the time.  Loss of interest.  MDD symptoms also include:  Sleeping too much or too little.  Eating too much or too little.  A change in your weight.  Feeling tired (fatigue) or having low energy.  Feeling worthless.  Feeling guilty.  Trouble making decisions.  Trouble thinking clearly.  Thoughts of suicide or harming others.  Feeling weak.  Feeling agitated.  Keeping yourself from being around other people  (isolation).  Follow these instructions at home: Activity  Do these things as told by your doctor: ? Go back to your normal activities. ? Exercise regularly. ? Spend time outdoors. Alcohol  Talk with your doctor about how alcohol can affect your antidepressant medicines.  Do not drink alcohol. Or, limit how much alcohol you drink. ? This means no more than 1 drink a day for nonpregnant women and 2 drinks a day for men. One drink equals one of these:  12 oz of beer.  5 oz of wine.  1 oz of hard liquor. General instructions  Take over-the-counter and prescription medicines only as told by your doctor.  Eat a healthy diet.  Get plenty of sleep.  Find activities that you enjoy. Make time to do them.  Think about joining a support group. Your doctor may be able to suggest a group for you.  Keep all follow-up visits as told by your doctor. This is important. Where to find more information:  [employer] on Mental Illness: ? www.nami.[place]: ? https://carter.com/  National Suicide Prevention Lifeline: ? 936 516 0435. This is free, 24-hour help. Contact a doctor if:  Your symptoms get worse.  You have new symptoms. Get help right away if:  You self-harm.  You see, hear, taste, smell, or feel things that are not present (hallucinate). If you ever feel like you may hurt yourself or others, or have thoughts about taking your own life, get help right away. You can go to your nearest emergency department or call:  Your local emergency services (911 in the [place]  A suicide crisis helpline, such as the National Suicide Prevention Lifeline: ? (325)816-3380. This is open 24 hours a day.  This information is not intended to replace advice given to you by your health care provider. Make sure you discuss any questions you have with your health care provider. Document Released: 05/15/2015 Document Revised: 02/18/2016 Document  Reviewed: 02/18/2016 Elsevier Interactive Patient Education  2017 Elsevier Inc.      Agustina Caroli, MD [facility]

## 2018-01-09 ENCOUNTER — Encounter: Payer: Self-pay | Admitting: Emergency Medicine

## 2018-01-09 ENCOUNTER — Ambulatory Visit (INDEPENDENT_AMBULATORY_CARE_PROVIDER_SITE_OTHER): Payer: PRIVATE HEALTH INSURANCE

## 2018-01-09 ENCOUNTER — Ambulatory Visit: Payer: PRIVATE HEALTH INSURANCE | Admitting: Emergency Medicine

## 2018-01-09 VITALS — BP 110/70 | HR 76 | Temp 98.5°F | Resp 17 | Ht 65.0 in | Wt 168.0 lb

## 2018-01-09 DIAGNOSIS — R1013 Epigastric pain: Secondary | ICD-10-CM | POA: Diagnosis not present

## 2018-01-09 LAB — POCT CBC
Granulocyte percent: 66.4 %G (ref 37–80)
HEMATOCRIT: 48.6 % (ref 43.5–53.7)
Hemoglobin: 15.1 g/dL (ref 14.1–18.1)
Lymph, poc: 2.6 (ref 0.6–3.4)
MCH, POC: 28.1 pg (ref 27–31.2)
MCHC: 31 g/dL — AB (ref 31.8–35.4)
MCV: 90.6 fL (ref 80–97)
MID (CBC): 0.6 (ref 0–0.9)
MPV: 6.8 fL (ref 0–99.8)
POC Granulocyte: 6.4 (ref 2–6.9)
POC LYMPH %: 27.2 % (ref 10–50)
POC MID %: 6.4 % (ref 0–12)
Platelet Count, POC: 289 10*3/uL (ref 142–424)
RBC: 5.36 M/uL (ref 4.69–6.13)
RDW, POC: 13.6 %
WBC: 9.7 10*3/uL (ref 4.6–10.2)

## 2018-01-09 NOTE — Progress Notes (Signed)
Manuel Lang 52 y.o.   Chief Complaint  Patient presents with  . Abdominal Pain    after taking wellbutrin     HISTORY OF PRESENT ILLNESS: This is a 52 y.o. male seen by me on 12/31/2017 with anxiety and depression.  Started on Wellbutrin.  Has been taking it in the morning.  2 days ago developed epigastric pain with nausea and diarrhea.  No vomiting and no blood in the stools.  Denies fever or chills.  No other significant symptoms.  Smoker.  No EtOH abuser.  Not taking excessive aspirin or NSAIDs recently.  HPI   Prior to Admission medications   Medication Sig Start Date End Date Taking? Authorizing Provider  buPROPion (WELLBUTRIN XL) 150 MG 24 hr tablet Take 1 tablet (150 mg total) by mouth daily. 12/31/17  Yes Sanaia Jasso, Ines Bloomer, MD  doxycycline (VIBRA-TABS) 100 MG tablet Take 1 tablet (100 mg total) by mouth 2 (two) times daily. 12/31/17  Yes Mackenzey Crownover, Ines Bloomer, MD  ALPRAZolam Duanne Moron) 0.25 MG tablet Take 1 tablet (0.25 mg total) by mouth daily as needed for anxiety. Patient not taking: Reported on 01/09/2018 06/21/16   Horald Pollen, MD    No Known Allergies  Patient Active Problem List   Diagnosis Date Noted  . Anxiety state 06/21/2016  . Rash and nonspecific skin eruption 06/21/2016  . Migraine with aura and without status migrainosus, not intractable 06/21/2016  . Acne 11/09/2013  . Nephrolithiasis 11/09/2013  . Nicotine addiction 11/09/2013  . Depression 04/08/2013  . Vertigo 04/08/2013    Past Medical History:  Diagnosis Date  . Depression     Past Surgical History:  Procedure Laterality Date  . HAND SURGERY  2010   Left    Social History   Socioeconomic History  . Marital status: Married    Spouse name: [name]  . Number of children: 3  . Years of education: Not on file  . Highest education level: Not on file  Occupational History  . Occupation: Production manager: [employer]  Social Needs  . Financial resource strain:  Not on file  . Food insecurity:    Worry: Not on file    Inability: Not on file  . Transportation needs:    Medical: Not on file    Non-medical: Not on file  Tobacco Use  . Smoking status: Current Every Day Smoker  . Smokeless tobacco: Never Used  Substance and Sexual Activity  . Alcohol use: No  . Drug use: No  . Sexual activity: Not on file  Lifestyle  . Physical activity:    Days per week: Not on file    Minutes per session: Not on file  . Stress: Not on file  Relationships  . Social connections:    Talks on phone: Not on file    Gets together: Not on file    Attends religious service: Not on file    Active member of club or organization: Not on file    Attends meetings of clubs or organizations: Not on file    Relationship status: Not on file  . Intimate partner violence:    Fear of current or ex partner: Not on file    Emotionally abused: Not on file    Physically abused: Not on file    Forced sexual activity: Not on file  Other Topics Concern  . Not on file  Social History Narrative   Patient lives at home with his wife and children, 1  grandson.   Patient works at [employer], full-time.   Patient is married to [place].   Patient drinks 4-20oz of [facility].   Patient is right-handed.   Patient has 3 children.   Patient has a 7th grade education.    No family history on file.   Review of Systems  Constitutional: Negative.  Negative for chills and fever.  HENT: Negative.  Negative for sore throat.   Eyes: Negative.   Respiratory: Negative.  Negative for cough and shortness of breath.   Cardiovascular: Negative.  Negative for chest pain and palpitations.  Gastrointestinal: Positive for abdominal pain, diarrhea and nausea. Negative for blood in stool, melena and vomiting.  Genitourinary: Negative.  Negative for dysuria and hematuria.  Musculoskeletal: Negative.  Negative for back pain, myalgias and neck pain.  Skin: Negative.  Negative for rash.    Neurological: Negative.  Negative for dizziness and headaches.  Endo/Heme/Allergies: Negative.   All other systems reviewed and are negative.   Vitals:   01/09/18 0951  BP: 129/90  Pulse: 76  Resp: 17  Temp: 98.5 F (36.9 C)  SpO2: 98%    Physical Exam  Constitutional: He is oriented to person, place, and time. He appears well-developed and well-nourished.  HENT:  Head: Normocephalic and atraumatic.  Nose: Nose normal.  Mouth/Throat: Oropharynx is clear and moist.  Eyes: Pupils are equal, round, and reactive to light. Conjunctivae and EOM are normal.  Neck: Normal range of motion. Neck supple.  Cardiovascular: Normal rate, regular rhythm and normal heart sounds.  Pulmonary/Chest: Effort normal and breath sounds normal.  Abdominal: Soft. Normal appearance and bowel sounds are normal. He exhibits no distension, no abdominal bruit and no pulsatile midline mass. There is no hepatosplenomegaly. There is tenderness in the epigastric area. There is no guarding and no CVA tenderness.    Musculoskeletal: Normal range of motion. He exhibits no edema.  Lymphadenopathy:    He has no cervical adenopathy.  Neurological: He is alert and oriented to person, place, and time.  Skin: Skin is warm and dry. Capillary refill takes less than 2 seconds.  Psychiatric: He has a normal mood and affect. His behavior is normal.  Vitals reviewed.   Results for orders placed or performed in visit on 01/09/18 (from the past 24 hour(s))  POCT CBC     Status: Abnormal   Collection Time: 01/09/18 10:51 AM  Result Value Ref Range   WBC 9.7 4.6 - 10.2 K/uL   Lymph, poc 2.6 0.6 - 3.4   POC LYMPH PERCENT 27.2 10 - 50 %L   MID (cbc) 0.6 0 - 0.9   POC MID % 6.4 0 - 12 %M   POC Granulocyte 6.4 2 - 6.9   Granulocyte percent 66.4 37 - 80 %G   RBC 5.36 4.69 - 6.13 M/uL   Hemoglobin 15.1 14.1 - 18.1 g/dL   HCT, POC 48.6 43.5 - 53.7 %   MCV 90.6 80 - 97 fL   MCH, POC 28.1 27 - 31.2 pg   MCHC 31.0 (A) 31.8 -  35.4 g/dL   RDW, POC 13.6 %   Platelet Count, POC 289 142 - 424 K/uL   MPV 6.8 0 - 99.8 fL   Dg Abd Acute W/chest  Result Date: 01/09/2018 CLINICAL DATA:  Epigastric pain EXAM: DG ABDOMEN ACUTE W/ 1V CHEST COMPARISON:  11/09/2013 FINDINGS: Cardiac shadow is within normal limits. The lungs are well aerated bilaterally. No acute bony abnormality is seen. Scattered large and small  bowel gas is noted. A moderate amount of retained fecal material is noted consistent with a degree of constipation. No free air is seen. No abnormal mass or abnormal calcifications are noted. No bony abnormality is seen. IMPRESSION: Changes of mild constipation.  No other focal abnormality is seen. Electronically Signed   By: Inez Catalina M.D.   On: 01/09/2018 10:36   Epigastric pain Clinically stable.  Vital signs stable.  Nonacute abdomen.  No red flag signs or symptoms.  Advised to stop Wellbutrin for 48 hours and restart on Sunday.  Advised to pay attention to his diet and hydration.  Will monitor the symptoms.  If condition changes or deteriorates he needs to go to the emergency room.  Follow-up here as needed.   ASSESSMENT & PLAN: Kaelen was seen today for abdominal pain.  Diagnoses and all orders for this visit:  Epigastric pain -     POCT CBC -     Comprehensive metabolic panel -     Lipase -     DG Abd Acute W/Chest; Future    Patient Instructions       IF you received an x-ray today, you will receive an invoice from [facility]. Please contact [facility] at [phone] with questions or concerns regarding your invoice.   IF you received labwork today, you will receive an invoice from [place]. Please contact [facility] at [phone] with questions or concerns regarding your invoice.   Our billing staff will not be able to assist you with questions regarding bills from these companies.  You will be contacted with the lab results as soon as they are available. The fastest way to  get your results is to activate your My Chart account. Instructions are located on the last page of this paperwork. If you have not heard from [place] regarding the results in 2 weeks, please contact this office.      Abdominal Pain, Adult Many things can cause belly (abdominal) pain. Most times, belly pain is not dangerous. Many cases of belly pain can be watched and treated at home. Sometimes belly pain is serious, though. Your doctor will try to find the cause of your belly pain. Follow these instructions at home:  Take over-the-counter and prescription medicines only as told by your doctor. Do not take medicines that help you poop (laxatives) unless told to by your doctor.  Drink enough fluid to keep your pee (urine) clear or pale yellow.  Watch your belly pain for any changes.  Keep all follow-up visits as told by your doctor. This is important. Contact a doctor if:  Your belly pain changes or gets worse.  You are not hungry, or you lose weight without trying.  You are having trouble pooping (constipated) or have watery poop (diarrhea) for more than 2-3 days.  You have pain when you pee or poop.  Your belly pain wakes you up at night.  Your pain gets worse with meals, after eating, or with certain foods.  You are throwing up and cannot keep anything down.  You have a fever. Get help right away if:  Your pain does not go away as soon as your doctor says it should.  You cannot stop throwing up.  Your pain is only in areas of your belly, such as the right side or the left lower part of the belly.  You have bloody or black poop, or poop that looks like tar.  You have very bad pain, cramping, or bloating in your  belly.  You have signs of not having enough fluid or water in your body (dehydration), such as: ? Dark pee, very little pee, or no pee. ? Cracked lips. ? Dry mouth. ? Sunken eyes. ? Sleepiness. ? Weakness. This information is not intended to replace advice given  to you by your health care provider. Make sure you discuss any questions you have with your health care provider. Document Released: 11/20/2007 Document Revised: 12/22/2015 Document Reviewed: 11/15/2015 Elsevier Interactive Patient Education  2018 Elsevier Inc.      Agustina Caroli, MD [facility]

## 2018-01-09 NOTE — Patient Instructions (Addendum)
     IF you received an x-ray today, you will receive an invoice from [facility]. Please contact [facility] at [phone] with questions or concerns regarding your invoice.   IF you received labwork today, you will receive an invoice from [facility]. Please contact [facility] at [phone] with questions or concerns regarding your invoice.   Our billing staff will not be able to assist you with questions regarding bills from these companies.  You will be contacted with the lab results as soon as they are available. The fastest way to get your results is to activate your My Chart account. Instructions are located on the last page of this paperwork. If you have not heard from us regarding the results in 2 weeks, please contact this office.      Abdominal Pain, Adult Many things can cause belly (abdominal) pain. Most times, belly pain is not dangerous. Many cases of belly pain can be watched and treated at home. Sometimes belly pain is serious, though. Your doctor will try to find the cause of your belly pain. Follow these instructions at home:  Take over-the-counter and prescription medicines only as told by your doctor. Do not take medicines that help you poop (laxatives) unless told to by your doctor.  Drink enough fluid to keep your pee (urine) clear or pale yellow.  Watch your belly pain for any changes.  Keep all follow-up visits as told by your doctor. This is important. Contact a doctor if:  Your belly pain changes or gets worse.  You are not hungry, or you lose weight without trying.  You are having trouble pooping (constipated) or have watery poop (diarrhea) for more than 2-3 days.  You have pain when you pee or poop.  Your belly pain wakes you up at night.  Your pain gets worse with meals, after eating, or with certain foods.  You are throwing up and cannot keep anything down.  You have a fever. Get help right away if:  Your pain does not go  away as soon as your doctor says it should.  You cannot stop throwing up.  Your pain is only in areas of your belly, such as the right side or the left lower part of the belly.  You have bloody or black poop, or poop that looks like tar.  You have very bad pain, cramping, or bloating in your belly.  You have signs of not having enough fluid or water in your body (dehydration), such as: ? Dark pee, very little pee, or no pee. ? Cracked lips. ? Dry mouth. ? Sunken eyes. ? Sleepiness. ? Weakness. This information is not intended to replace advice given to you by your health care provider. Make sure you discuss any questions you have with your health care provider. Document Released: 11/20/2007 Document Revised: 12/22/2015 Document Reviewed: 11/15/2015 Elsevier Interactive Patient Education  2018 [employer]  

## 2018-01-09 NOTE — Assessment & Plan Note (Signed)
Clinically stable.  Vital signs stable.  Nonacute abdomen.  No red flag signs or symptoms.  Advised to stop Wellbutrin for 48 hours and restart on Sunday.  Advised to pay attention to his diet and hydration.  Will monitor the symptoms.  If condition changes or deteriorates he needs to go to the emergency room.  Follow-up here as needed.

## 2018-01-10 LAB — COMPREHENSIVE METABOLIC PANEL
ALBUMIN: 4 g/dL (ref 3.5–5.5)
ALT: 10 IU/L (ref 0–44)
AST: 12 IU/L (ref 0–40)
Albumin/Globulin Ratio: 1.3 (ref 1.2–2.2)
Alkaline Phosphatase: 81 IU/L (ref 39–117)
BUN/Creatinine Ratio: 11 (ref 9–20)
BUN: 9 mg/dL (ref 6–24)
Bilirubin Total: 0.3 mg/dL (ref 0.0–1.2)
CALCIUM: 9.3 mg/dL (ref 8.7–10.2)
CO2: 23 mmol/L (ref 20–29)
CREATININE: 0.83 mg/dL (ref 0.76–1.27)
Chloride: 104 mmol/L (ref 96–106)
GFR calc non Af Amer: 103 mL/min/{1.73_m2} (ref >59)
GFR, EST AFRICAN AMERICAN: 119 mL/min/{1.73_m2} (ref >59)
GLOBULIN, TOTAL: 3.1 g/dL (ref 1.5–4.5)
Glucose: 73 mg/dL (ref 65–99)
Potassium: 4.2 mmol/L (ref 3.5–5.2)
SODIUM: 142 mmol/L (ref 134–144)
TOTAL PROTEIN: 7.1 g/dL (ref 6.0–8.5)

## 2018-01-10 LAB — LIPASE: Lipase: 26 U/L (ref 13–78)

## 2018-01-12 ENCOUNTER — Encounter: Payer: Self-pay | Admitting: *Deleted

## 2018-01-12 NOTE — Progress Notes (Signed)
Letter sent.

## 2018-01-24 ENCOUNTER — Ambulatory Visit (INDEPENDENT_AMBULATORY_CARE_PROVIDER_SITE_OTHER): Payer: PRIVATE HEALTH INSURANCE | Admitting: Emergency Medicine

## 2018-01-24 ENCOUNTER — Encounter: Payer: Self-pay | Admitting: Emergency Medicine

## 2018-01-24 ENCOUNTER — Other Ambulatory Visit: Payer: Self-pay

## 2018-01-24 VITALS — BP 131/84 | HR 74 | Temp 97.9°F | Resp 16 | Ht 65.0 in | Wt 162.4 lb

## 2018-01-24 DIAGNOSIS — Z0001 Encounter for general adult medical examination with abnormal findings: Secondary | ICD-10-CM | POA: Diagnosis not present

## 2018-01-24 DIAGNOSIS — F411 Generalized anxiety disorder: Secondary | ICD-10-CM | POA: Diagnosis not present

## 2018-01-24 MED ORDER — DULOXETINE HCL 30 MG PO CPEP: 30.0000 mg | ORAL_CAPSULE | Freq: Every day | ORAL | 3 refills | Status: DC

## 2018-01-24 MED ORDER — ALPRAZOLAM 0.25 MG PO TABS: 0.2500 mg | ORAL_TABLET | Freq: Three times a day (TID) | ORAL | 1 refills | Status: DC | PRN

## 2018-01-24 NOTE — Patient Instructions (Addendum)
   IF you received an x-ray today, you will receive an invoice from [facility]. Please contact  Radiology at [phone] with questions or concerns regarding your invoice.   IF you received labwork today, you will receive an invoice from [facility]. Please contact [facility] at [phone] with questions or concerns regarding your invoice.   Our billing staff will not be able to assist you with questions regarding bills from these companies.  You will be contacted with the lab results as soon as they are available. The fastest way to get your results is to activate your My Chart account. Instructions are located on the last page of this paperwork. If you have not heard from us regarding the results in 2 weeks, please contact this office.      Health Maintenance, Male A healthy lifestyle and preventive care is important for your health and wellness. Ask your health care provider about what schedule of regular examinations is right for you. What should I know about weight and diet? Eat a Healthy Diet  Eat plenty of vegetables, fruits, whole grains, low-fat dairy products, and lean protein.  Do not eat a lot of foods high in solid fats, added sugars, or salt.  Maintain a Healthy Weight Regular exercise can help you achieve or maintain a healthy weight. You should:  Do at least 150 minutes of exercise each week. The exercise should increase your heart rate and make you sweat (moderate-intensity exercise).  Do strength-training exercises at least twice a week.  Watch Your Levels of Cholesterol and Blood Lipids  Have your blood tested for lipids and cholesterol every 5 years starting at 50 years of age. If you are at high risk for heart disease, you should start having your blood tested when you are 50 years old. You may need to have your cholesterol levels checked more often if: ? Your lipid or cholesterol levels are high. ? You are older than 50 years of age. ? You  are at high risk for heart disease.  What should I know about cancer screening? Many types of cancers can be detected early and may often be prevented. Lung Cancer  You should be screened every year for lung cancer if: ? You are a current smoker who has smoked for at least 30 years. ? You are a former smoker who has quit within the past 15 years.  Talk to your health care provider about your screening options, when you should start screening, and how often you should be screened.  Colorectal Cancer  Routine colorectal cancer screening usually begins at 50 years of age and should be repeated every 5-10 years until you are 50 years old. You may need to be screened more often if early forms of precancerous polyps or small growths are found. Your health care provider may recommend screening at an earlier age if you have risk factors for colon cancer.  Your health care provider may recommend using home test kits to check for hidden blood in the stool.  A small camera at the end of a tube can be used to examine your colon (sigmoidoscopy or colonoscopy). This checks for the earliest forms of colorectal cancer.  Prostate and Testicular Cancer  Depending on your age and overall health, your health care provider may do certain tests to screen for prostate and testicular cancer.  Talk to your health care provider about any symptoms or concerns you have about testicular or prostate cancer.  Skin Cancer  Check your skin   from head to toe regularly.  Tell your health care provider about any new moles or changes in moles, especially if: ? There is a change in a mole's size, shape, or color. ? You have a mole that is larger than a pencil eraser.  Always use sunscreen. Apply sunscreen liberally and repeat throughout the day.  Protect yourself by wearing long sleeves, pants, a wide-brimmed hat, and sunglasses when outside.  What should I know about heart disease, diabetes, and high blood  pressure?  If you are 18-39 years of age, have your blood pressure checked every 3-5 years. If you are 40 years of age or older, have your blood pressure checked every year. You should have your blood pressure measured twice-once when you are at a hospital or clinic, and once when you are not at a hospital or clinic. Record the average of the two measurements. To check your blood pressure when you are not at a hospital or clinic, you can use: ? An automated blood pressure machine at a pharmacy. ? A home blood pressure monitor.  Talk to your health care provider about your target blood pressure.  If you are between 45-79 years old, ask your health care provider if you should take aspirin to prevent heart disease.  Have regular diabetes screenings by checking your fasting blood sugar level. ? If you are at a normal weight and have a low risk for diabetes, have this test once every three years after the age of 45. ? If you are overweight and have a high risk for diabetes, consider being tested at a younger age or more often.  A one-time screening for abdominal aortic aneurysm (AAA) by ultrasound is recommended for men aged 65-75 years who are current or former smokers. What should I know about preventing infection? Hepatitis B If you have a higher risk for hepatitis B, you should be screened for this virus. Talk with your health care provider to find out if you are at risk for hepatitis B infection. Hepatitis C Blood testing is recommended for:  Everyone born from 1945 through 1965.  Anyone with known risk factors for hepatitis C.  Sexually Transmitted Diseases (STDs)  You should be screened each year for STDs including gonorrhea and chlamydia if: ? You are sexually active and are younger than 50 years of age. ? You are older than 50 years of age and your health care provider tells you that you are at risk for this type of infection. ? Your sexual activity has changed since you were last  screened and you are at an increased risk for chlamydia or gonorrhea. Ask your health care provider if you are at risk.  Talk with your health care provider about whether you are at high risk of being infected with HIV. Your health care provider may recommend a prescription medicine to help prevent HIV infection.  What else can I do?  Schedule regular health, dental, and eye exams.  Stay current with your vaccines (immunizations).  Do not use any tobacco products, such as cigarettes, chewing tobacco, and e-cigarettes. If you need help quitting, ask your health care provider.  Limit alcohol intake to no more than 2 drinks per day. One drink equals 12 ounces of beer, 5 ounces of wine, or 1 ounces of hard liquor.  Do not use street drugs.  Do not share needles.  Ask your health care provider for help if you need support or information about quitting drugs.  Tell your health care   provider if you often feel depressed.  Tell your health care provider if you have ever been abused or do not feel safe at home. This information is not intended to replace advice given to you by your health care provider. Make sure you discuss any questions you have with your health care provider. Document Released: 11/30/2007 Document Revised: 01/31/2016 Document Reviewed: 03/07/2015 Elsevier Interactive Patient Education  2018 [employer]  

## 2018-01-24 NOTE — Progress Notes (Addendum)
Manuel Lang 52 y.o.   Chief Complaint  Patient presents with  . Annual Exam    HISTORY OF PRESENT ILLNESS: This is a 52 y.o. male here for annual exam. Chronic smoker with history of chronic generalized anxiety and lately with frequent panic attacks.  Seen by me on 12/31/2017 with anxiety and depression.  As requested by patient he was started on Wellbutrin but developed epigastric pain.  Stopped taking the medication and epigastric pain resolved. Depression screen [facility] 2/9 01/24/2018 01/09/2018 12/31/2017 06/21/2016 09/10/2015  Decreased Interest 0 0 2 0 0  Down, Depressed, Hopeless 0 0 2 0 0  PHQ - 2 Score 0 0 4 0 0  Altered sleeping - - 0 - -  Tired, decreased energy - - 2 - -  Change in appetite - - 2 - -  Feeling bad or failure about yourself  - - 2 - -  Trouble concentrating - - 2 - -  Moving slowly or fidgety/restless - - 2 - -  Suicidal thoughts - - 0 - -  PHQ-9 Score - - 14 - -  Difficult doing work/chores - - Somewhat difficult - -   Other than chronic general anxiety, no other medical concerns. HPI   Prior to Admission medications   Medication Sig Start Date End Date Taking? Authorizing Provider  doxycycline (VIBRA-TABS) 100 MG tablet Take 1 tablet (100 mg total) by mouth 2 (two) times daily. 12/31/17  Yes Manuel Lang, Manuel Bloomer, MD  ALPRAZolam Manuel Lang) 0.25 MG tablet Take 1 tablet (0.25 mg total) by mouth daily as needed for anxiety. Patient not taking: Reported on 01/09/2018 06/21/16   Manuel Pollen, MD  buPROPion (WELLBUTRIN XL) 150 MG 24 hr tablet Take 1 tablet (150 mg total) by mouth daily. Patient not taking: Reported on 01/24/2018 12/31/17   Manuel Pollen, MD    No Known Allergies  Patient Active Problem List   Diagnosis Date Noted  . Epigastric pain 01/09/2018  . Anxiety state 06/21/2016  . Rash and nonspecific skin eruption 06/21/2016  . Migraine with aura and without status migrainosus, not intractable 06/21/2016  . Acne 11/09/2013  .  Nephrolithiasis 11/09/2013  . Nicotine addiction 11/09/2013  . Depression 04/08/2013  . Vertigo 04/08/2013    Past Medical History:  Diagnosis Date  . Depression     Past Surgical History:  Procedure Laterality Date  . HAND SURGERY  2010   Left    Social History   Socioeconomic History  . Marital status: Married    Spouse name: Manuel Lang  . Number of children: 3  . Years of education: Not on file  . Highest education level: Not on file  Occupational History  . Occupation: Production manager: [employer]  Social Needs  . Financial resource strain: Not on file  . Food insecurity:    Worry: Not on file    Inability: Not on file  . Transportation needs:    Medical: Not on file    Non-medical: Not on file  Tobacco Use  . Smoking status: Current Every Day Smoker  . Smokeless tobacco: Never Used  Substance and Sexual Activity  . Alcohol use: No  . Drug use: No  . Sexual activity: Not on file  Lifestyle  . Physical activity:    Days per week: Not on file    Minutes per session: Not on file  . Stress: Not on file  Relationships  . Social connections:    Talks on phone:  Not on file    Gets together: Not on file    Attends religious service: Not on file    Active member of club or organization: Not on file    Attends meetings of clubs or organizations: Not on file    Relationship status: Not on file  . Intimate partner violence:    Fear of current or ex partner: Not on file    Emotionally abused: Not on file    Physically abused: Not on file    Forced sexual activity: Not on file  Other Topics Concern  . Not on file  Social History Narrative   Patient lives at home with his wife and children, 1 grandson.   Patient works at [employer], full-time.   Patient is married to Manuel Lang.   Patient drinks 4-20oz of [facility].   Patient is right-handed.   Patient has 3 children.   Patient has a 7th grade education.    No family history on  file.   Review of Systems  Constitutional: Negative.  Negative for chills, fever and weight loss.  HENT: Negative.  Negative for congestion, hearing loss and sore throat.   Eyes: Negative.  Negative for blurred vision, double vision and photophobia.  Respiratory: Negative.  Negative for cough, hemoptysis, shortness of breath and wheezing.   Cardiovascular: Negative.  Negative for chest pain, palpitations and leg swelling.  Gastrointestinal: Negative.  Negative for abdominal pain, blood in stool, diarrhea, nausea and vomiting.  Genitourinary: Negative.  Negative for dysuria, frequency, hematuria and urgency.  Musculoskeletal: Negative.   Skin: Negative.  Negative for rash.  Neurological: Negative.  Negative for dizziness, focal weakness and headaches.  Endo/Heme/Allergies: Negative.   Psychiatric/Behavioral: The patient is nervous/anxious.   All other systems reviewed and are negative.   Vitals:   01/24/18 0818  BP: 131/84  Pulse: 74  Resp: 16  Temp: 97.9 F (36.6 C)  SpO2: 97%    Physical Exam  Constitutional: He is oriented to person, place, and time. He appears well-developed and well-nourished.  HENT:  Head: Normocephalic and atraumatic.  Nose: Nose normal.  Mouth/Throat: Oropharynx is clear and moist.  Eyes: Pupils are equal, round, and reactive to light. Conjunctivae and EOM are normal.  Neck: Normal range of motion. Neck supple. No JVD present. No thyromegaly present.  Cardiovascular: Normal rate, regular rhythm and normal heart sounds.  Pulmonary/Chest: Effort normal and breath sounds normal. No respiratory distress.  Abdominal: Soft. Bowel sounds are normal. He exhibits no distension. There is no tenderness.  Musculoskeletal: Normal range of motion. He exhibits no edema or tenderness.  Neurological: He is alert and oriented to person, place, and time. No sensory deficit. He exhibits normal muscle tone. Coordination normal.  Skin: Skin is warm and dry. Capillary  refill takes less than 2 seconds.  Psychiatric: He has a normal mood and affect. His behavior is normal.  Vitals reviewed.  The 10-year ASCVD risk score Mikey Bussing DC Brooke Bonito., et al., 2013) is: 15.9%   Values used to calculate the score:     Age: 29 years     Sex: Male     Is Non-Hispanic African American: No     Diabetic: No     Tobacco smoker: Yes     Systolic Blood Pressure: 932 mmHg     Is BP treated: No     HDL Cholesterol: 24 mg/dL     Total Cholesterol: 199 mg/dL   ASSESSMENT & PLAN: Leshaun was seen today for annual exam.  Diagnoses and all orders for this visit:  Encounter for general adult medical examination with abnormal findings -     Hemoglobin A1c -     Lipid panel -     PSA(Must document that pt has been informed of limitations of PSA testing.) -     TSH -     HIV antibody  GAD (generalized anxiety disorder) -     DULoxetine (CYMBALTA) 30 MG capsule; Take 1 capsule (30 mg total) by mouth daily. -     ALPRAZolam (XANAX) 0.25 MG tablet; Take 1 tablet (0.25 mg total) by mouth every 8 (eight) hours as needed for anxiety.     Patient Instructions       IF you received an x-ray today, you will receive an invoice from [facility]. Please contact [facility] at [phone] with questions or concerns regarding your invoice.   IF you received labwork today, you will receive an invoice from [facility]. Please contact [facility] at [phone] with questions or concerns regarding your invoice.   Our billing staff will not be able to assist you with questions regarding bills from these companies.  You will be contacted with the lab results as soon as they are available. The fastest way to get your results is to activate your My Chart account. Instructions are located on the last page of this paperwork. If you have not heard from [place] regarding the results in 2 weeks, please contact this office.      Health Maintenance, Male A healthy lifestyle and preventive  care is important for your health and wellness. Ask your health care provider about what schedule of regular examinations is right for you. What should I know about weight and diet? Eat a Healthy Diet  Eat plenty of vegetables, fruits, whole grains, low-fat dairy products, and lean protein.  Do not eat a lot of foods high in solid fats, added sugars, or salt.  Maintain a Healthy Weight Regular exercise can help you achieve or maintain a healthy weight. You should:  Do at least 150 minutes of exercise each week. The exercise should increase your heart rate and make you sweat (moderate-intensity exercise).  Do strength-training exercises at least twice a week.  Watch Your Levels of Cholesterol and Blood Lipids  Have your blood tested for lipids and cholesterol every 5 years starting at 50 years of age. If you are at high risk for heart disease, you should start having your blood tested when you are 50 years old. You may need to have your cholesterol levels checked more often if: ? Your lipid or cholesterol levels are high. ? You are older than 50 years of age. ? You are at high risk for heart disease.  What should I know about cancer screening? Many types of cancers can be detected early and may often be prevented. Lung Cancer  You should be screened every year for lung cancer if: ? You are a current smoker who has smoked for at least 30 years. ? You are a former smoker who has quit within the past 15 years.  Talk to your health care provider about your screening options, when you should start screening, and how often you should be screened.  Colorectal Cancer  Routine colorectal cancer screening usually begins at 50 years of age and should be repeated every 5-10 years until you are 50 years old. You may need to be screened more often if early forms of precancerous polyps or small growths are found. Your  health care provider may recommend screening at an earlier age if you have risk  factors for colon cancer.  Your health care provider may recommend using home test kits to check for hidden blood in the stool.  A small camera at the end of a tube can be used to examine your colon (sigmoidoscopy or colonoscopy). This checks for the earliest forms of colorectal cancer.  Prostate and Testicular Cancer  Depending on your age and overall health, your health care provider may do certain tests to screen for prostate and testicular cancer.  Talk to your health care provider about any symptoms or concerns you have about testicular or prostate cancer.  Skin Cancer  Check your skin from head to toe regularly.  Tell your health care provider about any new moles or changes in moles, especially if: ? There is a change in a mole's size, shape, or color. ? You have a mole that is larger than a pencil eraser.  Always use sunscreen. Apply sunscreen liberally and repeat throughout the day.  Protect yourself by wearing long sleeves, pants, a wide-brimmed hat, and sunglasses when outside.  What should I know about heart disease, diabetes, and high blood pressure?  If you are 60-97 years of age, have your blood pressure checked every 3-5 years. If you are 27 years of age or older, have your blood pressure checked every year. You should have your blood pressure measured twice-once when you are at a hospital or clinic, and once when you are not at a hospital or clinic. Record the average of the two measurements. To check your blood pressure when you are not at a hospital or clinic, you can use: ? An automated blood pressure machine at a pharmacy. ? A home blood pressure monitor.  Talk to your health care provider about your target blood pressure.  If you are between 69-44 years old, ask your health care provider if you should take aspirin to prevent heart disease.  Have regular diabetes screenings by checking your fasting blood sugar level. ? If you are at a normal weight and have a  low risk for diabetes, have this test once every three years after the age of 32. ? If you are overweight and have a high risk for diabetes, consider being tested at a younger age or more often.  A one-time screening for abdominal aortic aneurysm (AAA) by ultrasound is recommended for men aged 74-75 years who are current or former smokers. What should I know about preventing infection? Hepatitis B If you have a higher risk for hepatitis B, you should be screened for this virus. Talk with your health care provider to find out if you are at risk for hepatitis B infection. Hepatitis C Blood testing is recommended for:  Everyone born from [birth year] through 1963.  Anyone with known risk factors for hepatitis C.  Sexually Transmitted Diseases (STDs)  You should be screened each year for STDs including gonorrhea and chlamydia if: ? You are sexually active and are younger than 50 years of age. ? You are older than 50 years of age and your health care provider tells you that you are at risk for this type of infection. ? Your sexual activity has changed since you were last screened and you are at an increased risk for chlamydia or gonorrhea. Ask your health care provider if you are at risk.  Talk with your health care provider about whether you are at high risk of being infected with HIV. Your  health care provider may recommend a prescription medicine to help prevent HIV infection.  What else can I do?  Schedule regular health, dental, and eye exams.  Stay current with your vaccines (immunizations).  Do not use any tobacco products, such as cigarettes, chewing tobacco, and e-cigarettes. If you need help quitting, ask your health care provider.  Limit alcohol intake to no more than 2 drinks per day. One drink equals 12 ounces of beer, 5 ounces of wine, or 1 ounces of hard liquor.  Do not use street drugs.  Do not share needles.  Ask your health care provider for help if you need support or  information about quitting drugs.  Tell your health care provider if you often feel depressed.  Tell your health care provider if you have ever been abused or do not feel safe at home. This information is not intended to replace advice given to you by your health care provider. Make sure you discuss any questions you have with your health care provider. Document Released: 11/30/2007 Document Revised: 01/31/2016 Document Reviewed: 03/07/2015 Elsevier Interactive Patient Education  2018 Elsevier Inc.     Agustina Caroli, MD [facility]

## 2018-01-25 LAB — HEMOGLOBIN A1C
Est. average glucose Bld gHb Est-mCnc: 120 mg/dL
Hgb A1c MFr Bld: 5.8 % — ABNORMAL HIGH (ref 4.8–5.6)

## 2018-01-25 LAB — LIPID PANEL
CHOL/HDL RATIO: 8.3 ratio — AB (ref 0.0–5.0)
Cholesterol, Total: 199 mg/dL (ref 100–199)
HDL: 24 mg/dL — AB (ref >39)
LDL CALC: 133 mg/dL — AB (ref 0–99)
Triglycerides: 209 mg/dL — ABNORMAL HIGH (ref 0–149)
VLDL Cholesterol Cal: 42 mg/dL — ABNORMAL HIGH (ref 5–40)

## 2018-01-25 LAB — PSA: PROSTATE SPECIFIC AG, SERUM: 1 ng/mL (ref 0.0–4.0)

## 2018-01-25 LAB — HIV ANTIBODY (ROUTINE TESTING W REFLEX): HIV Screen 4th Generation wRfx: NONREACTIVE

## 2018-01-25 LAB — TSH: TSH: 1.36 u[IU]/mL (ref 0.450–4.500)

## 2018-01-26 ENCOUNTER — Other Ambulatory Visit: Payer: Self-pay | Admitting: Emergency Medicine

## 2018-01-26 ENCOUNTER — Encounter: Payer: Self-pay | Admitting: Emergency Medicine

## 2018-01-26 DIAGNOSIS — E785 Hyperlipidemia, unspecified: Secondary | ICD-10-CM

## 2018-01-26 MED ORDER — PRAVASTATIN SODIUM 40 MG PO TABS: 40.0000 mg | ORAL_TABLET | Freq: Every day | ORAL | 3 refills | Status: DC

## 2018-01-27 ENCOUNTER — Encounter: Payer: Self-pay | Admitting: Radiology

## 2018-04-12 ENCOUNTER — Other Ambulatory Visit: Payer: Self-pay | Admitting: Emergency Medicine

## 2018-04-12 DIAGNOSIS — F329 Major depressive disorder, single episode, unspecified: Secondary | ICD-10-CM

## 2018-04-12 DIAGNOSIS — F419 Anxiety disorder, unspecified: Principal | ICD-10-CM

## 2018-05-15 ENCOUNTER — Other Ambulatory Visit: Payer: Self-pay | Admitting: Emergency Medicine

## 2018-05-15 DIAGNOSIS — F411 Generalized anxiety disorder: Secondary | ICD-10-CM

## 2018-05-15 DIAGNOSIS — F329 Major depressive disorder, single episode, unspecified: Secondary | ICD-10-CM

## 2018-05-15 DIAGNOSIS — F419 Anxiety disorder, unspecified: Principal | ICD-10-CM

## 2018-08-14 ENCOUNTER — Other Ambulatory Visit: Payer: Self-pay | Admitting: Emergency Medicine

## 2018-08-14 DIAGNOSIS — F419 Anxiety disorder, unspecified: Principal | ICD-10-CM

## 2018-08-14 DIAGNOSIS — F32A Depression, unspecified: Secondary | ICD-10-CM

## 2018-08-14 DIAGNOSIS — F411 Generalized anxiety disorder: Secondary | ICD-10-CM

## 2018-08-14 DIAGNOSIS — F329 Major depressive disorder, single episode, unspecified: Secondary | ICD-10-CM

## 2018-08-14 NOTE — Telephone Encounter (Signed)
Requested Prescriptions  Pending Prescriptions Disp Refills  . buPROPion (WELLBUTRIN XL) 150 MG 24 hr tablet [Pharmacy Med Name: BUPROPION HCL XL 150 MG TABLET] 30 tablet 0    Sig: TAKE 1 TABLET BY MOUTH EVERY DAY     Psychiatry: Antidepressants - bupropion Failed - 08/14/2018  2:02 AM      Failed - Valid encounter within last 6 months    Recent Outpatient Visits          6 months ago Encounter for general adult medical examination with abnormal findings   Primary [facility] at [facility], Ines Bloomer, MD   7 months ago Epigastric pain   Primary Care at [place], Ines Bloomer, MD   7 months ago Anxiety and depression   Primary Care at [place], Ines Bloomer, MD   2 years ago Anxiety state   Primary Care at [place], [place], MD   2 years ago Rash and nonspecific skin eruption   [facility], Synetta Shadow, MD             Passed - Completed PHQ-2 or PHQ-9 in the last 360 days.      Passed - Last BP in normal range    BP Readings from Last 1 Encounters:  01/24/18 131/84       . DULoxetine (CYMBALTA) 30 MG capsule [Pharmacy Med Name: DULOXETINE HCL DR 30 MG CAP] 30 capsule 2    Sig: TAKE 1 CAPSULE BY MOUTH EVERY DAY     Psychiatry: Antidepressants - SNRI Failed - 08/14/2018  2:02 AM      Failed - Valid encounter within last 6 months    Recent Outpatient Visits          6 months ago Encounter for general adult medical examination with abnormal findings   [facility] Care at [facility], Ines Bloomer, MD   7 months ago Epigastric pain   Primary Care at [place], Ines Bloomer, MD   7 months ago Anxiety and depression   Primary Care at [place], Ines Bloomer, MD   2 years ago Anxiety state   Primary Care at [place], [place], MD   2 years ago Rash and nonspecific skin eruption   [facility], Synetta Shadow, MD             Passed - Completed PHQ-2 or PHQ-9 in the last 360 days.      Passed - Last BP in  normal range    BP Readings from Last 1 Encounters:  01/24/18 131/84       Pt <3 months overdue - pt given 30 day courtesy refill on each med requested. Called pt and left message to please call office and make an appointment

## 2018-09-07 ENCOUNTER — Other Ambulatory Visit: Payer: Self-pay | Admitting: Emergency Medicine

## 2018-09-07 ENCOUNTER — Telehealth: Payer: Self-pay | Admitting: Emergency Medicine

## 2018-09-07 DIAGNOSIS — F411 Generalized anxiety disorder: Secondary | ICD-10-CM

## 2018-09-07 DIAGNOSIS — F419 Anxiety disorder, unspecified: Principal | ICD-10-CM

## 2018-09-07 DIAGNOSIS — F329 Major depressive disorder, single episode, unspecified: Secondary | ICD-10-CM

## 2018-09-07 NOTE — Telephone Encounter (Signed)
Left VM in regards to making an appt for the patient in one month with Dr. Mitchel Honour. We would schedule him for a Virtual OV.

## 2018-09-18 ENCOUNTER — Ambulatory Visit (INDEPENDENT_AMBULATORY_CARE_PROVIDER_SITE_OTHER): Payer: PRIVATE HEALTH INSURANCE | Admitting: Family Medicine

## 2018-09-18 ENCOUNTER — Other Ambulatory Visit: Payer: Self-pay

## 2018-09-18 ENCOUNTER — Telehealth: Payer: Self-pay | Admitting: Emergency Medicine

## 2018-09-18 DIAGNOSIS — E785 Hyperlipidemia, unspecified: Secondary | ICD-10-CM

## 2018-09-18 DIAGNOSIS — R7303 Prediabetes: Secondary | ICD-10-CM

## 2018-09-18 NOTE — Telephone Encounter (Signed)
Left VM to let patient know to keep lab appt at normal time.

## 2018-09-18 NOTE — Progress Notes (Signed)
NURSE VISIT LAB ONLY

## 2018-09-19 ENCOUNTER — Encounter: Payer: Self-pay | Admitting: Emergency Medicine

## 2018-09-19 LAB — HEMOGLOBIN A1C
Est. average glucose Bld gHb Est-mCnc: 114 mg/dL
Hgb A1c MFr Bld: 5.6 % (ref 4.8–5.6)

## 2018-09-19 LAB — COMPREHENSIVE METABOLIC PANEL
ALT: 17 IU/L (ref 0–44)
AST: 14 IU/L (ref 0–40)
Albumin/Globulin Ratio: 1.6 (ref 1.2–2.2)
Albumin: 4.2 g/dL (ref 4.0–5.0)
Alkaline Phosphatase: 89 IU/L (ref 39–117)
BUN/Creatinine Ratio: 10 (ref 9–20)
BUN: 8 mg/dL (ref 6–24)
Bilirubin Total: 0.3 mg/dL (ref 0.0–1.2)
CO2: 21 mmol/L (ref 20–29)
Calcium: 9 mg/dL (ref 8.7–10.2)
Chloride: 99 mmol/L (ref 96–106)
Creatinine, Ser: 0.77 mg/dL (ref 0.76–1.27)
GFR calc Af Amer: 122 mL/min/{1.73_m2} (ref >59)
GFR calc non Af Amer: 106 mL/min/{1.73_m2} (ref >59)
Globulin, Total: 2.6 g/dL (ref 1.5–4.5)
Glucose: 75 mg/dL (ref 65–99)
Potassium: 4.2 mmol/L (ref 3.5–5.2)
Sodium: 139 mmol/L (ref 134–144)
Total Protein: 6.8 g/dL (ref 6.0–8.5)

## 2018-09-19 LAB — LIPID PANEL
Chol/HDL Ratio: 6.7 ratio — ABNORMAL HIGH (ref 0.0–5.0)
Cholesterol, Total: 174 mg/dL (ref 100–199)
HDL: 26 mg/dL — ABNORMAL LOW (ref >39)
LDL Calculated: 112 mg/dL — ABNORMAL HIGH (ref 0–99)
Triglycerides: 179 mg/dL — ABNORMAL HIGH (ref 0–149)
VLDL Cholesterol Cal: 36 mg/dL (ref 5–40)

## 2018-09-24 ENCOUNTER — Telehealth: Payer: Self-pay | Admitting: *Deleted

## 2018-09-24 ENCOUNTER — Encounter: Payer: Self-pay | Admitting: Emergency Medicine

## 2018-09-24 ENCOUNTER — Telehealth: Payer: Self-pay | Admitting: Emergency Medicine

## 2018-09-24 ENCOUNTER — Telehealth (INDEPENDENT_AMBULATORY_CARE_PROVIDER_SITE_OTHER): Payer: PRIVATE HEALTH INSURANCE | Admitting: Emergency Medicine

## 2018-09-24 ENCOUNTER — Other Ambulatory Visit: Payer: Self-pay

## 2018-09-24 DIAGNOSIS — F411 Generalized anxiety disorder: Secondary | ICD-10-CM

## 2018-09-24 DIAGNOSIS — E785 Hyperlipidemia, unspecified: Secondary | ICD-10-CM

## 2018-09-24 MED ORDER — DULOXETINE HCL 30 MG PO CPEP: ORAL_CAPSULE | ORAL | 3 refills | Status: DC

## 2018-09-24 MED ORDER — ALPRAZOLAM 0.25 MG PO TABS: 0.2500 mg | ORAL_TABLET | Freq: Three times a day (TID) | ORAL | 1 refills | Status: DC | PRN

## 2018-09-24 MED ORDER — PRAVASTATIN SODIUM 40 MG PO TABS: 40.0000 mg | ORAL_TABLET | Freq: Every day | ORAL | 3 refills | Status: DC

## 2018-09-24 NOTE — Progress Notes (Signed)
Patient called back to be triaged. Patient needs medication refill on xanax, pravastatin and Cymbalta

## 2018-09-24 NOTE — Telephone Encounter (Signed)
I tried to contact patient to triage for Telemed visit with Dr Mitchel Honour at 4:00 pm. Each time (3 times) I got his mobile number/home voice mail, and a message was left to call back. He called and left a message to call [phone] because he missed my call. No answer when I returned th call.

## 2018-09-24 NOTE — Progress Notes (Signed)
Telemedicine Encounter- SOAP NOTE Established Patient  This telephone encounter was conducted with the patient's (or proxy's) verbal consent via audio telecommunications: yes/no: Yes Patient was instructed to have this encounter in a suitably private space; and to only have persons present to whom they give permission to participate. In addition, patient identity was confirmed by use of name plus two identifiers (DOB and address).  I discussed the limitations, risks, security and privacy concerns of performing an evaluation and management service by telephone and the availability of in person appointments. I also discussed with the patient that there may be a patient responsible charge related to this service. The patient expressed understanding and agreed to proceed.  I spent a total of TIME; 0 MIN TO 60 MIN: 15 minutes talking with the patient or their proxy.  No chief complaint on file. Follow-up on chronic medical problems  Subjective   Manuel Lang is a 53 y.o. male established patient. Telephone visit today for follow-up of chronic medical problems and medication refills.  No complaints or medical concerns today.  HPI   Patient Active Problem List   Diagnosis Date Noted  . GAD (generalized anxiety disorder) 01/24/2018  . Anxiety state 06/21/2016  . Acne 11/09/2013  . Nephrolithiasis 11/09/2013  . Nicotine addiction 11/09/2013  . Depression 04/08/2013    Past Medical History:  Diagnosis Date  . Depression     Current Outpatient Medications  Medication Sig Dispense Refill  . ALPRAZolam (XANAX) 0.25 MG tablet Take 1 tablet (0.25 mg total) by mouth every 8 (eight) hours as needed for anxiety. 30 tablet 1  . doxycycline (VIBRA-TABS) 100 MG tablet Take 1 tablet (100 mg total) by mouth 2 (two) times daily. 20 tablet 1  . DULoxetine (CYMBALTA) 30 MG capsule TAKE 1 CAPSULE BY MOUTH EVERY DAY 90 capsule 3  . pravastatin (PRAVACHOL) 40 MG tablet Take 1 tablet (40 mg total) by  mouth daily. 90 tablet 3  . buPROPion (WELLBUTRIN XL) 150 MG 24 hr tablet TAKE 1 TABLET BY MOUTH EVERY DAY (Patient not taking: Reported on 09/24/2018) 30 tablet 0   No current facility-administered medications for this visit.     No Known Allergies  Social History   Socioeconomic History  . Marital status: Married    Spouse name: Manuel Lang  . Number of children: 3  . Years of education: Not on file  . Highest education level: Not on file  Occupational History  . Occupation: Production manager: [employer]  Social Needs  . Financial resource strain: Not on file  . Food insecurity:    Worry: Not on file    Inability: Not on file  . Transportation needs:    Medical: Not on file    Non-medical: Not on file  Tobacco Use  . Smoking status: Current Every Day Smoker  . Smokeless tobacco: Never Used  Substance and Sexual Activity  . Alcohol use: No  . Drug use: No  . Sexual activity: Not on file  Lifestyle  . Physical activity:    Days per week: Not on file    Minutes per session: Not on file  . Stress: Not on file  Relationships  . Social connections:    Talks on phone: Not on file    Gets together: Not on file    Attends religious service: Not on file    Active member of club or organization: Not on file    Attends meetings of clubs or organizations: Not  on file    Relationship status: Not on file  . Intimate partner violence:    Fear of current or ex partner: Not on file    Emotionally abused: Not on file    Physically abused: Not on file    Forced sexual activity: Not on file  Other Topics Concern  . Not on file  Social History Narrative   Patient lives at home with his wife and children, 1 grandson.   Patient works at [employer], full-time.   Patient is married to [name].   Patient drinks 4-20oz of [facility].   Patient is right-handed.   Patient has 3 children.   Patient has a 7th grade education.    Review of Systems  Constitutional:  Negative.  Negative for chills and fever.  HENT: Negative for congestion and sore throat.   Respiratory: Negative for cough and shortness of breath.   Cardiovascular: Negative for chest pain and palpitations.  Gastrointestinal: Negative for abdominal pain, diarrhea, nausea and vomiting.  Musculoskeletal: Negative for myalgias.  Neurological: Negative for dizziness and headaches.  All other systems reviewed and are negative.   Objective   Vitals as reported by the patient: None available There were no vitals filed for this visit. Alert and oriented x3, no respiratory distress. Diagnoses and all orders for this visit:  GAD (generalized anxiety disorder) -     ALPRAZolam (XANAX) 0.25 MG tablet; Take 1 tablet (0.25 mg total) by mouth every 8 (eight) hours as needed for anxiety. -     DULoxetine (CYMBALTA) 30 MG capsule; TAKE 1 CAPSULE BY MOUTH EVERY DAY  Dyslipidemia -     pravastatin (PRAVACHOL) 40 MG tablet; Take 1 tablet (40 mg total) by mouth daily.  Clinically stable.  No medical concerns identified during this visit. Continue present medications. Follow-up in 3 to 6 months.   I discussed the assessment and treatment plan with the patient. The patient was provided an opportunity to ask questions and all were answered. The patient agreed with the plan and demonstrated an understanding of the instructions.   The patient was advised to call back or seek an in-person evaluation if the symptoms worsen or if the condition fails to improve as anticipated.  I provided 15 minutes of non-face-to-face time during this encounter.  Horald Pollen, MD  Primary Care at [facility]

## 2018-09-24 NOTE — Telephone Encounter (Signed)
09/24/2018 - PATIENT HAD A TELEMED VISIT WITH DR. Kittie Plater ON Thursday 09/24/2018. DR. Kittie Plater WOULD LIKE HIM TO FOLLOW-UP IN 3 MONTHS. I TRIED TO CALL HIM TO GET THIS SCHEDULED FOR July BUT I HAD TO LEAVE HIM A VOICE MAIL. [place]

## 2018-09-24 NOTE — Telephone Encounter (Signed)
Called patient's mobile number at 2:45 pm, left message in voice mail to call back to be triage for his 4:00 pm appointment. I will try to contact patient again.

## 2018-10-10 ENCOUNTER — Other Ambulatory Visit: Payer: Self-pay | Admitting: Emergency Medicine

## 2018-10-10 DIAGNOSIS — F329 Major depressive disorder, single episode, unspecified: Secondary | ICD-10-CM

## 2018-10-10 DIAGNOSIS — F419 Anxiety disorder, unspecified: Principal | ICD-10-CM

## 2018-10-28 ENCOUNTER — Other Ambulatory Visit: Payer: Self-pay | Admitting: Emergency Medicine

## 2018-10-28 DIAGNOSIS — R21 Rash and other nonspecific skin eruption: Secondary | ICD-10-CM

## 2018-10-28 NOTE — Telephone Encounter (Signed)
Review for refill for doxycycline hyclate 100 mg tab.  No protocol for this medication.  LR 12/31/2017 for 20 tabs  Dr. Mitchel Honour Per pharmacy, need DX Code  Last visit was tele visit on 09/24/18

## 2018-11-03 ENCOUNTER — Other Ambulatory Visit: Payer: Self-pay | Admitting: Emergency Medicine

## 2018-11-03 DIAGNOSIS — F32A Depression, unspecified: Secondary | ICD-10-CM

## 2018-11-03 DIAGNOSIS — F329 Major depressive disorder, single episode, unspecified: Secondary | ICD-10-CM

## 2018-11-03 NOTE — Telephone Encounter (Signed)
Requested medication (s) are due for refill today: yes  Requested medication (s) are on the active medication list: yes  Last refill:  10/10/18  Future visit scheduled: no  Notes to clinic:  Pt due for OV. Called pt and left message for pt to call for appt. Per chart, pt needs to have f/u in July per Dr Mitchel Honour. Pt has received to previous #30 courtesy refills.  Routing to office for review and advise  Requested Prescriptions  Pending Prescriptions Disp Refills   buPROPion (WELLBUTRIN XL) 150 MG 24 hr tablet [Pharmacy Med Name: BUPROPION HCL XL 150 MG TABLET] 90 tablet 1    Sig: TAKE 1 [place]     Psychiatry: Antidepressants - bupropion Failed - 11/03/2018  9:33 AM      Failed - Valid encounter within last 6 months    Recent Outpatient Visits          1 month ago Dyslipidemia   [facility] [place], MD   9 months ago Encounter for general adult medical examination with abnormal findings   [facility], [place], MD   9 months ago Epigastric pain   [facility] [place], Ines Bloomer, MD   10 months ago Anxiety and depression   [facility] [place], Ines Bloomer, MD   2 years ago Anxiety state   [facility] [place], [place], MD             Passed - Completed PHQ-2 or PHQ-9 in the last 360 days.      Passed - Last BP in normal range    BP Readings from Last 1 Encounters:  01/24/18 131/84

## 2018-12-23 ENCOUNTER — Encounter: Payer: Self-pay | Admitting: Emergency Medicine

## 2018-12-23 ENCOUNTER — Other Ambulatory Visit: Payer: Self-pay

## 2018-12-23 ENCOUNTER — Ambulatory Visit (INDEPENDENT_AMBULATORY_CARE_PROVIDER_SITE_OTHER): Payer: PRIVATE HEALTH INSURANCE | Admitting: Emergency Medicine

## 2018-12-23 VITALS — BP 143/91 | HR 91 | Temp 98.7°F | Resp 18 | Ht 66.0 in | Wt 167.4 lb

## 2018-12-23 DIAGNOSIS — R21 Rash and other nonspecific skin eruption: Secondary | ICD-10-CM

## 2018-12-23 DIAGNOSIS — F411 Generalized anxiety disorder: Secondary | ICD-10-CM | POA: Diagnosis not present

## 2018-12-23 DIAGNOSIS — E785 Hyperlipidemia, unspecified: Secondary | ICD-10-CM

## 2018-12-23 MED ORDER — DULOXETINE HCL 30 MG PO CPEP: ORAL_CAPSULE | ORAL | 3 refills | Status: DC

## 2018-12-23 MED ORDER — DOXYCYCLINE HYCLATE 100 MG PO TABS: 100.0000 mg | ORAL_TABLET | Freq: Two times a day (BID) | ORAL | 1 refills | Status: DC

## 2018-12-23 MED ORDER — ROSUVASTATIN CALCIUM 20 MG PO TABS: 20.0000 mg | ORAL_TABLET | Freq: Every day | ORAL | 3 refills | Status: DC

## 2018-12-23 MED ORDER — ALPRAZOLAM 0.25 MG PO TABS: 0.2500 mg | ORAL_TABLET | Freq: Three times a day (TID) | ORAL | 1 refills | Status: DC | PRN

## 2018-12-23 NOTE — Patient Instructions (Addendum)
   If you have lab work done today you will be contacted with your lab results within the next 2 weeks.  If you have not heard from us then please contact us. The fastest way to get your results is to register for My Chart.   IF you received an x-ray today, you will receive an invoice from [facility]. Please contact  Radiology at [phone] with questions or concerns regarding your invoice.   IF you received labwork today, you will receive an invoice from [facility]. Please contact [facility] at [phone] with questions or concerns regarding your invoice.   Our billing staff will not be able to assist you with questions regarding bills from these companies.  You will be contacted with the lab results as soon as they are available. The fastest way to get your results is to activate your My Chart account. Instructions are located on the last page of this paperwork. If you have not heard from us regarding the results in 2 weeks, please contact this office.     Living With Anxiety  After being diagnosed with an anxiety disorder, you may be relieved to know why you have felt or behaved a certain way. It is natural to also feel overwhelmed about the treatment ahead and what it will mean for your life. With care and support, you can manage this condition and recover from it. How to cope with anxiety Dealing with stress Stress is your body's reaction to life changes and events, both good and bad. Stress can last just a few hours or it can be ongoing. Stress can play a major role in anxiety, so it is important to learn both how to cope with stress and how to think about it differently. Talk with your health care provider or a counselor to learn more about stress reduction. He or she may suggest some stress reduction techniques, such as:  Music therapy. This can include creating or listening to music that you enjoy and that inspires you.  Mindfulness-based meditation. This  involves being aware of your normal breaths, rather than trying to control your breathing. It can be done while sitting or walking.  Centering prayer. This is a kind of meditation that involves focusing on a word, phrase, or sacred image that is meaningful to you and that brings you peace.  Deep breathing. To do this, expand your stomach and inhale slowly through your nose. Hold your breath for 3-5 seconds. Then exhale slowly, allowing your stomach muscles to relax.  Self-talk. This is a skill where you identify thought patterns that lead to anxiety reactions and correct those thoughts.  Muscle relaxation. This involves tensing muscles then relaxing them. Choose a stress reduction technique that fits your lifestyle and personality. Stress reduction techniques take time and practice. Set aside 5-15 minutes a day to do them. Therapists can offer training in these techniques. The training may be covered by some insurance plans. Other things you can do to manage stress include:  Keeping a stress diary. This can help you learn what triggers your stress and ways to control your response.  Thinking about how you respond to certain situations. You may not be able to control everything, but you can control your reaction.  Making time for activities that help you relax, and not feeling guilty about spending your time in this way. Therapy combined with coping and stress-reduction skills provides the best chance for successful treatment. Medicines Medicines can help ease symptoms. Medicines for anxiety include:    Anti-anxiety drugs.  Antidepressants.  Beta-blockers. Medicines may be used as the main treatment for anxiety disorder, along with therapy, or if other treatments are not working. Medicines should be prescribed by a health care provider. Relationships Relationships can play a big part in helping you recover. Try to spend more time connecting with trusted friends and family members. Consider  going to couples counseling, taking family education classes, or going to family therapy. Therapy can help you and others better understand the condition. How to recognize changes in your condition Everyone has a different response to treatment for anxiety. Recovery from anxiety happens when symptoms decrease and stop interfering with your daily activities at home or work. This may mean that you will start to:  Have better concentration and focus.  Sleep better.  Be less irritable.  Have more energy.  Have improved memory. It is important to recognize when your condition is getting worse. Contact your health care provider if your symptoms interfere with home or work and you do not feel like your condition is improving. Where to find help and support: You can get help and support from these sources:  Self-help groups.  Online and community organizations.  A trusted spiritual leader.  Couples counseling.  Family education classes.  Family therapy. Follow these instructions at home:  Eat a healthy diet that includes plenty of vegetables, fruits, whole grains, low-fat dairy products, and lean protein. Do not eat a lot of foods that are high in solid fats, added sugars, or salt.  Exercise. Most adults should do the following: ? Exercise for at least 150 minutes each week. The exercise should increase your heart rate and make you sweat (moderate-intensity exercise). ? Strengthening exercises at least twice a week.  Cut down on caffeine, tobacco, alcohol, and other potentially harmful substances.  Get the right amount and quality of sleep. Most adults need 7-9 hours of sleep each night.  Make choices that simplify your life.  Take over-the-counter and prescription medicines only as told by your health care provider.  Avoid caffeine, alcohol, and certain over-the-counter cold medicines. These may make you feel worse. Ask your pharmacist which medicines to avoid.  Keep all  follow-up visits as told by your health care provider. This is important. Questions to ask your health care provider  Would I benefit from therapy?  How often should I follow up with a health care provider?  How long do I need to take medicine?  Are there any long-term side effects of my medicine?  Are there any alternatives to taking medicine? Contact a health care provider if:  You have a hard time staying focused or finishing daily tasks.  You spend many hours a day feeling worried about everyday life.  You become exhausted by worry.  You start to have headaches, feel tense, or have nausea.  You urinate more than normal.  You have diarrhea. Get help right away if:  You have a racing heart and shortness of breath.  You have thoughts of hurting yourself or others. If you ever feel like you may hurt yourself or others, or have thoughts about taking your own life, get help right away. You can go to your nearest emergency department or call:  Your local emergency services (911 in the [place]  A suicide crisis helpline, such as the National Suicide Prevention Lifeline at 1-800-273-8255. This is open 24-hours a day. Summary  Taking steps to deal with stress can help calm you.  Medicines cannot cure anxiety disorders,   but they can help ease symptoms.  Family, friends, and partners can play a big part in helping you recover from an anxiety disorder. This information is not intended to replace advice given to you by your health care provider. Make sure you discuss any questions you have with your health care provider. Document Released: 05/28/2016 Document Revised: 05/16/2017 Document Reviewed: 05/28/2016 Elsevier Patient Education  2020 Elsevier Inc.  

## 2018-12-23 NOTE — Progress Notes (Signed)
Manuel Lang 53 y.o.   Chief Complaint  Patient presents with  . Medication Refill    xanax, pravastatin, CYMBALTA, doxycycline    HISTORY OF PRESENT ILLNESS: This is a 53 y.o. male here for follow-up and medication refill.  Doing well and has no complaints or medical concerns.  Has the following problems: 1.  Generalized anxiety disorder: On Cymbalta and Xanax as needed.  Using Xanax sparingly and judiciously. 2.  Dyslipidemia: On pravastatin, suboptimal results on last lipid profile.  Will change to Crestor. 3.  Chronic skin disorder: Occasionally needs to take doxycycline with good results.  HPI   Prior to Admission medications   Medication Sig Start Date End Date Taking? Authorizing Provider  ALPRAZolam (XANAX) 0.25 MG tablet Take 1 tablet (0.25 mg total) by mouth every 8 (eight) hours as needed for anxiety. 12/23/18  Yes Luvena Wentling, Ines Bloomer, MD  doxycycline (VIBRA-TABS) 100 MG tablet Take 1 tablet (100 mg total) by mouth 2 (two) times daily. 12/23/18  Yes Mykeal Carrick, Ines Bloomer, MD  DULoxetine (CYMBALTA) 30 MG capsule TAKE 1 CAPSULE BY MOUTH EVERY DAY 12/23/18  Yes Horald Pollen, MD  buPROPion (WELLBUTRIN XL) 150 MG 24 hr tablet Take 1 tablet (150 mg total) by mouth daily. Patient not taking: Reported on 12/23/2018 11/05/18   Horald Pollen, MD  rosuvastatin (CRESTOR) 20 MG tablet Take 1 tablet (20 mg total) by mouth daily. 12/23/18   Horald Pollen, MD    No Known Allergies  Patient Active Problem List   Diagnosis Date Noted  . GAD (generalized anxiety disorder) 01/24/2018  . Anxiety state 06/21/2016  . Acne 11/09/2013  . Nephrolithiasis 11/09/2013  . Nicotine addiction 11/09/2013  . Depression 04/08/2013    Past Medical History:  Diagnosis Date  . Depression     Past Surgical History:  Procedure Laterality Date  . HAND SURGERY  2010   Left    Social History   Socioeconomic History  . Marital status: Married    Spouse name: [name]  . Number of  children: 3  . Years of education: Not on file  . Highest education level: Not on file  Occupational History  . Occupation: Production manager: [employer]  Social Needs  . Financial resource strain: Not on file  . Food insecurity    Worry: Not on file    Inability: Not on file  . Transportation needs    Medical: Not on file    Non-medical: Not on file  Tobacco Use  . Smoking status: Current Every Day Smoker  . Smokeless tobacco: Never Used  Substance and Sexual Activity  . Alcohol use: No  . Drug use: No  . Sexual activity: Not on file  Lifestyle  . Physical activity    Days per week: Not on file    Minutes per session: Not on file  . Stress: Not on file  Relationships  . Social Herbalist on phone: Not on file    Gets together: Not on file    Attends religious service: Not on file    Active member of club or organization: Not on file    Attends meetings of clubs or organizations: Not on file    Relationship status: Not on file  . Intimate partner violence    Fear of current or ex partner: Not on file    Emotionally abused: Not on file    Physically abused: Not on file    Forced  sexual activity: Not on file  Other Topics Concern  . Not on file  Social History Narrative   Patient lives at home with his wife and children, 1 grandson.   Patient works at [employer], full-time.   Patient is married to [place].   Patient drinks 4-20oz of [facility].   Patient is right-handed.   Patient has 3 children.   Patient has a 7th grade education.    History reviewed. No pertinent family history.   Review of Systems  Constitutional: Negative.  Negative for chills and fever.  HENT: Negative.  Negative for congestion, nosebleeds and sore throat.   Eyes: Negative.  Negative for blurred vision and double vision.  Respiratory: Negative.  Negative for cough and shortness of breath.   Cardiovascular: Negative.  Negative for chest pain and  palpitations.  Gastrointestinal: Negative.  Negative for abdominal pain, blood in stool, diarrhea, nausea and vomiting.  Genitourinary: Negative.  Negative for dysuria.  Musculoskeletal: Negative.   Skin:       Chronic skin condition  Neurological: Negative for dizziness and headaches.  Endo/Heme/Allergies: Negative.   Psychiatric/Behavioral: The patient is nervous/anxious.   All other systems reviewed and are negative.  Vitals:   12/23/18 1339  BP: (!) 143/91  Pulse: 91  Resp: 18  Temp: 98.7 F (37.1 C)  SpO2: 98%     Physical Exam Vitals signs reviewed.  Constitutional:      Appearance: Normal appearance.  HENT:     Head: Normocephalic and atraumatic.  Eyes:     Extraocular Movements: Extraocular movements intact.     Pupils: Pupils are equal, round, and reactive to light.  Cardiovascular:     Rate and Rhythm: Normal rate and regular rhythm.     Heart sounds: Normal heart sounds.  Pulmonary:     Effort: Pulmonary effort is normal.     Breath sounds: Normal breath sounds.  Musculoskeletal: Normal range of motion.  Skin:    General: Skin is warm and dry.  Neurological:     General: No focal deficit present.     Mental Status: He is alert and oriented to person, place, and time.  Psychiatric:        Mood and Affect: Mood normal.        Behavior: Behavior normal.    A total of 25 minutes was spent in the room with the patient, greater than 50% of which was in counseling/coordination of care regarding chronic medical problems, treatment, medication and side effects, change of cholesterol medication, diet and nutrition, prognosis, and need for follow-up.   ASSESSMENT & PLAN: [name] was seen today for medication refill.  Diagnoses and all orders for this visit:  GAD (generalized anxiety disorder) -     ALPRAZolam (XANAX) 0.25 MG tablet; Take 1 tablet (0.25 mg total) by mouth every 8 (eight) hours as needed for anxiety. -     DULoxetine (CYMBALTA) 30 MG capsule; TAKE  1 CAPSULE BY MOUTH EVERY DAY  Rash and nonspecific skin eruption -     doxycycline (VIBRA-TABS) 100 MG tablet; Take 1 tablet (100 mg total) by mouth 2 (two) times daily.  Dyslipidemia -     rosuvastatin (CRESTOR) 20 MG tablet; Take 1 tablet (20 mg total) by mouth daily.    Patient Instructions       If you have lab work done today you will be contacted with your lab results within the next 2 weeks.  If you have not heard from [place] then please  contact us. The fastest way to get your results is to register for My Chart.   IF you received an x-ray today, you will receive an invoice from [facility]. Please contact [facility] at [phone] with questions or concerns regarding your invoice.   IF you received labwork today, you will receive an invoice from [facility]. Please contact [facility] at [phone] with questions or concerns regarding your invoice.   Our billing staff will not be able to assist you with questions regarding bills from these companies.  You will be contacted with the lab results as soon as they are available. The fastest way to get your results is to activate your My Chart account. Instructions are located on the last page of this paperwork. If you have not heard from [place] regarding the results in 2 weeks, please contact this office.     Living With Anxiety  After being diagnosed with an anxiety disorder, you may be relieved to know why you have felt or behaved a certain way. It is natural to also feel overwhelmed about the treatment ahead and what it will mean for your life. With care and support, you can manage this condition and recover from it. How to cope with anxiety Dealing with stress Stress is your body's reaction to life changes and events, both good and bad. Stress can last just a few hours or it can be ongoing. Stress can play a major role in anxiety, so it is important to learn both how to cope with stress and how to think about it  differently. Talk with your health care provider or a counselor to learn more about stress reduction. He or she may suggest some stress reduction techniques, such as:  Music therapy. This can include creating or listening to music that you enjoy and that inspires you.  Mindfulness-based meditation. This involves being aware of your normal breaths, rather than trying to control your breathing. It can be done while sitting or walking.  Centering prayer. This is a kind of meditation that involves focusing on a word, phrase, or sacred image that is meaningful to you and that brings you peace.  Deep breathing. To do this, expand your stomach and inhale slowly through your nose. Hold your breath for 3-5 seconds. Then exhale slowly, allowing your stomach muscles to relax.  Self-talk. This is a skill where you identify thought patterns that lead to anxiety reactions and correct those thoughts.  Muscle relaxation. This involves tensing muscles then relaxing them. Choose a stress reduction technique that fits your lifestyle and personality. Stress reduction techniques take time and practice. Set aside 5-15 minutes a day to do them. Therapists can offer training in these techniques. The training may be covered by some insurance plans. Other things you can do to manage stress include:  Keeping a stress diary. This can help you learn what triggers your stress and ways to control your response.  Thinking about how you respond to certain situations. You may not be able to control everything, but you can control your reaction.  Making time for activities that help you relax, and not feeling guilty about spending your time in this way. Therapy combined with coping and stress-reduction skills provides the best chance for successful treatment. Medicines Medicines can help ease symptoms. Medicines for anxiety include:  Anti-anxiety drugs.  Antidepressants.  Beta-blockers. Medicines may be used as the main  treatment for anxiety disorder, along with therapy, or if other treatments are not working. Medicines should be prescribed  by a health care provider. Relationships Relationships can play a big part in helping you recover. Try to spend more time connecting with trusted friends and family members. Consider going to couples counseling, taking family education classes, or going to family therapy. Therapy can help you and others better understand the condition. How to recognize changes in your condition Everyone has a different response to treatment for anxiety. Recovery from anxiety happens when symptoms decrease and stop interfering with your daily activities at home or work. This may mean that you will start to:  Have better concentration and focus.  Sleep better.  Be less irritable.  Have more energy.  Have improved memory. It is important to recognize when your condition is getting worse. Contact your health care provider if your symptoms interfere with home or work and you do not feel like your condition is improving. Where to find help and support: You can get help and support from these sources:  Self-help groups.  Online and [employer].  A trusted spiritual leader.  Couples counseling.  Family education classes.  Family therapy. Follow these instructions at home:  Eat a healthy diet that includes plenty of vegetables, fruits, whole grains, low-fat dairy products, and lean protein. Do not eat a lot of foods that are high in solid fats, added sugars, or salt.  Exercise. Most adults should do the following: ? Exercise for at least 150 minutes each week. The exercise should increase your heart rate and make you sweat (moderate-intensity exercise). ? Strengthening exercises at least twice a week.  Cut down on caffeine, tobacco, alcohol, and other potentially harmful substances.  Get the right amount and quality of sleep. Most adults need 7-9 hours of sleep each  night.  Make choices that simplify your life.  Take over-the-counter and prescription medicines only as told by your health care provider.  Avoid caffeine, alcohol, and certain over-the-counter cold medicines. These may make you feel worse. Ask your pharmacist which medicines to avoid.  Keep all follow-up visits as told by your health care provider. This is important. Questions to ask your health care provider  Would I benefit from therapy?  How often should I follow up with a health care provider?  How long do I need to take medicine?  Are there any long-term side effects of my medicine?  Are there any alternatives to taking medicine? Contact a health care provider if:  You have a hard time staying focused or finishing daily tasks.  You spend many hours a day feeling worried about everyday life.  You become exhausted by worry.  You start to have headaches, feel tense, or have nausea.  You urinate more than normal.  You have diarrhea. Get help right away if:  You have a racing heart and shortness of breath.  You have thoughts of hurting yourself or others. If you ever feel like you may hurt yourself or others, or have thoughts about taking your own life, get help right away. You can go to your nearest emergency department or call:  Your local emergency services (911 in the [place]  A suicide crisis helpline, such as the [facility] at 716-635-7512. This is open 24-hours a day. Summary  Taking steps to deal with stress can help calm you.  Medicines cannot cure anxiety disorders, but they can help ease symptoms.  Family, friends, and partners can play a big part in helping you recover from an anxiety disorder. This information is not intended to replace advice  given to you by your health care provider. Make sure you discuss any questions you have with your health care provider. Document Released: 05/28/2016 Document Revised: 05/16/2017  Document Reviewed: 05/28/2016 Elsevier Patient Education  2020 Elsevier Inc.       Agustina Caroli, MD [facility]

## 2019-04-08 IMAGING — DX DG ABDOMEN ACUTE W/ 1V CHEST
3 series · 3 of 3 positions shown · non-contrast
Comparison: 11/09/2013

CLINICAL DATA: Epigastric pain

EXAM:
DG ABDOMEN ACUTE W/ 1V CHEST

[chest pa]
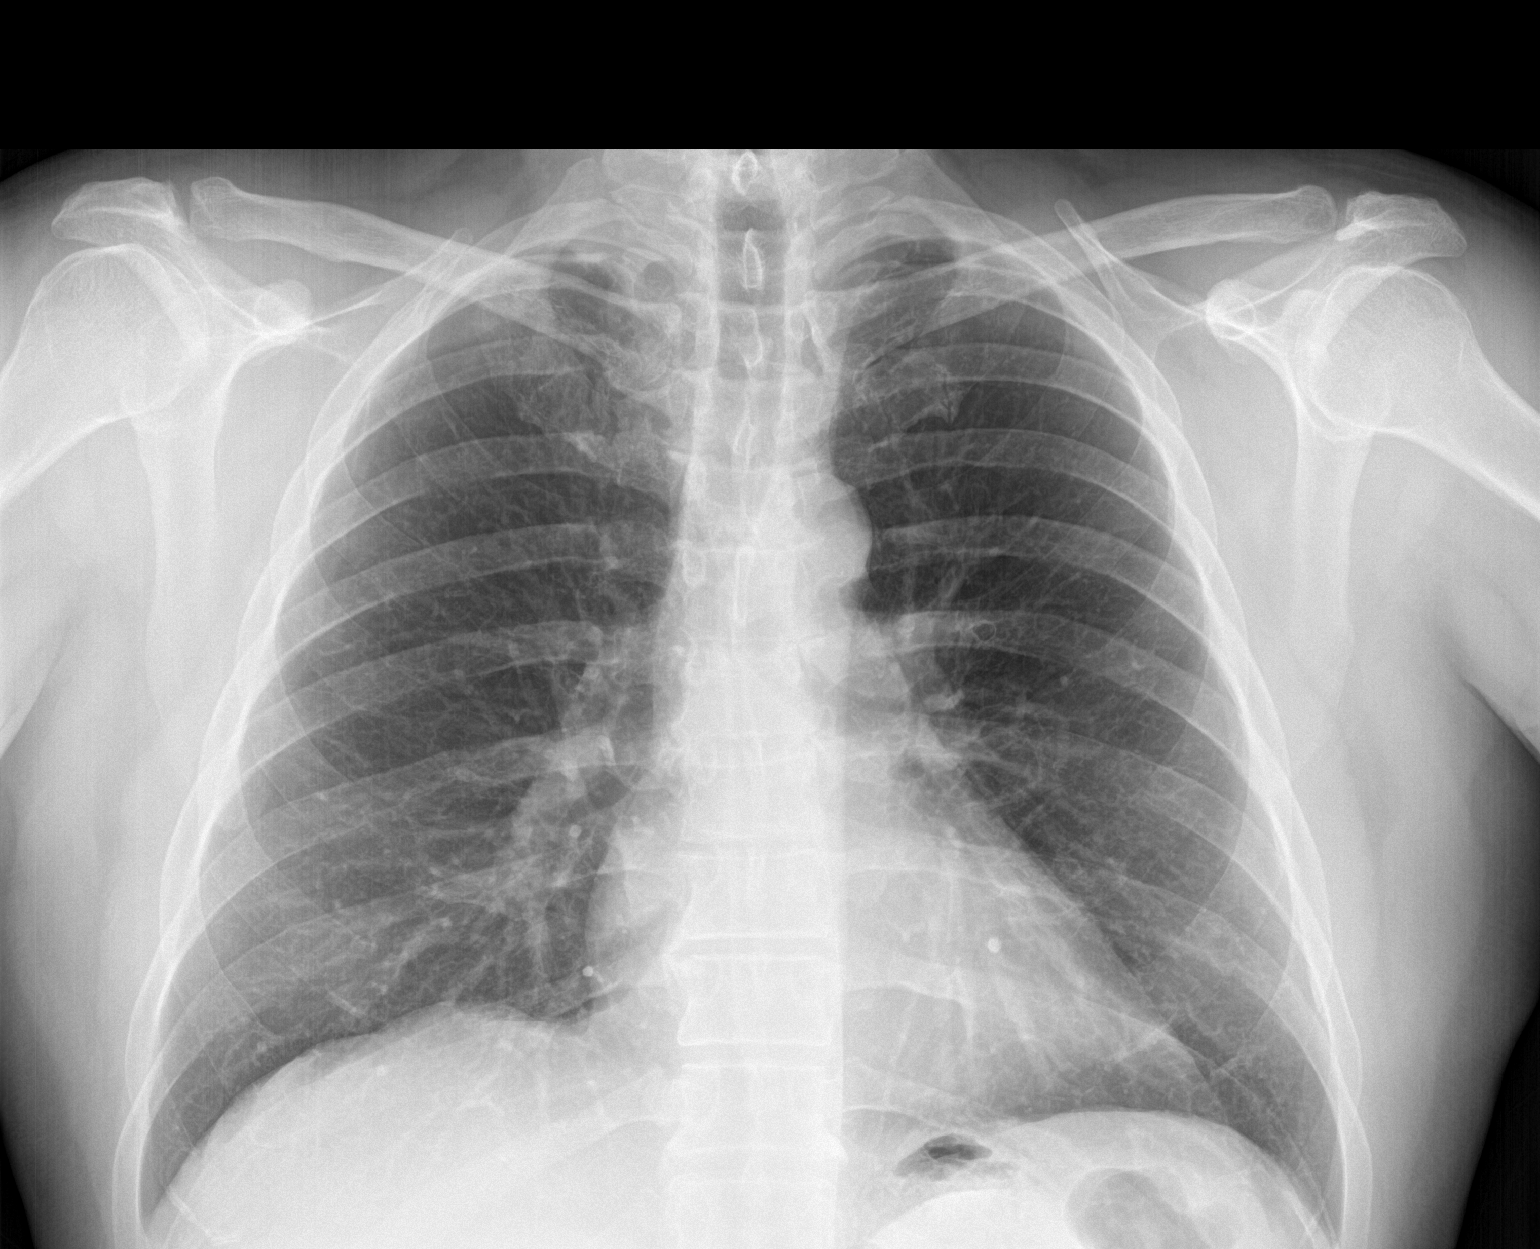

[abdomen erect]
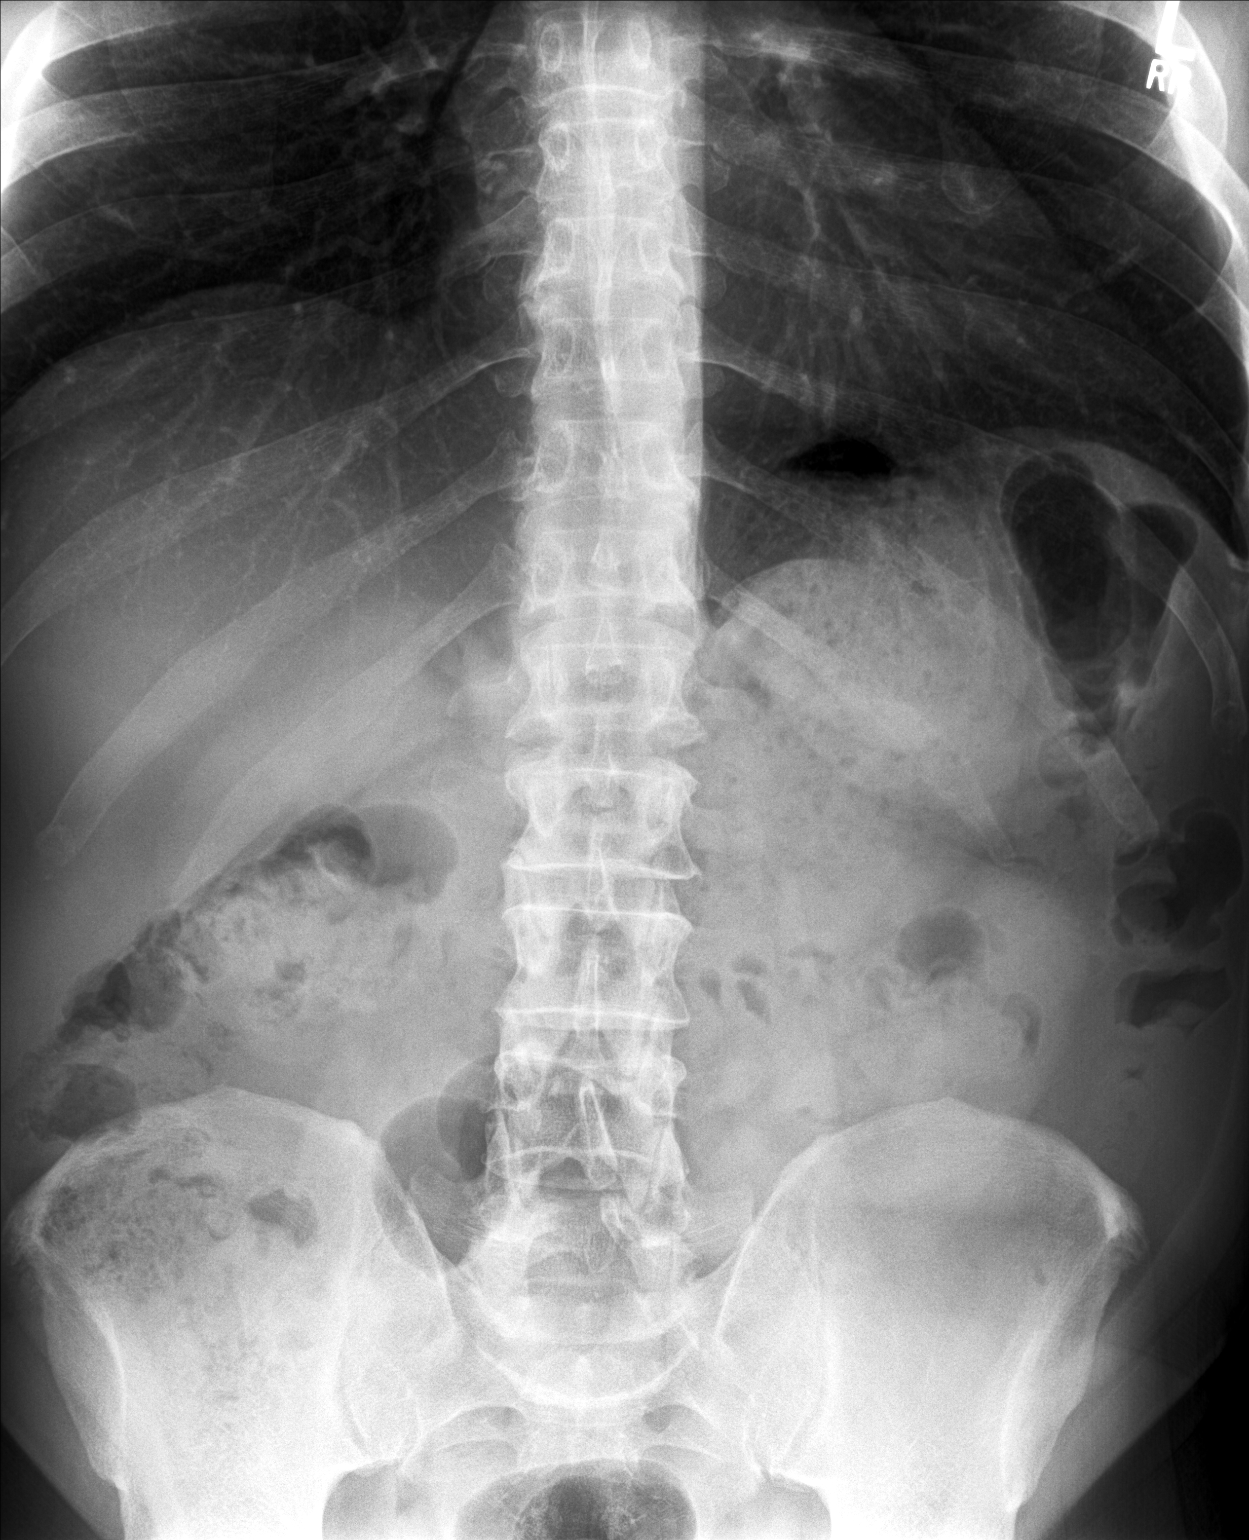

[abdomen supine]
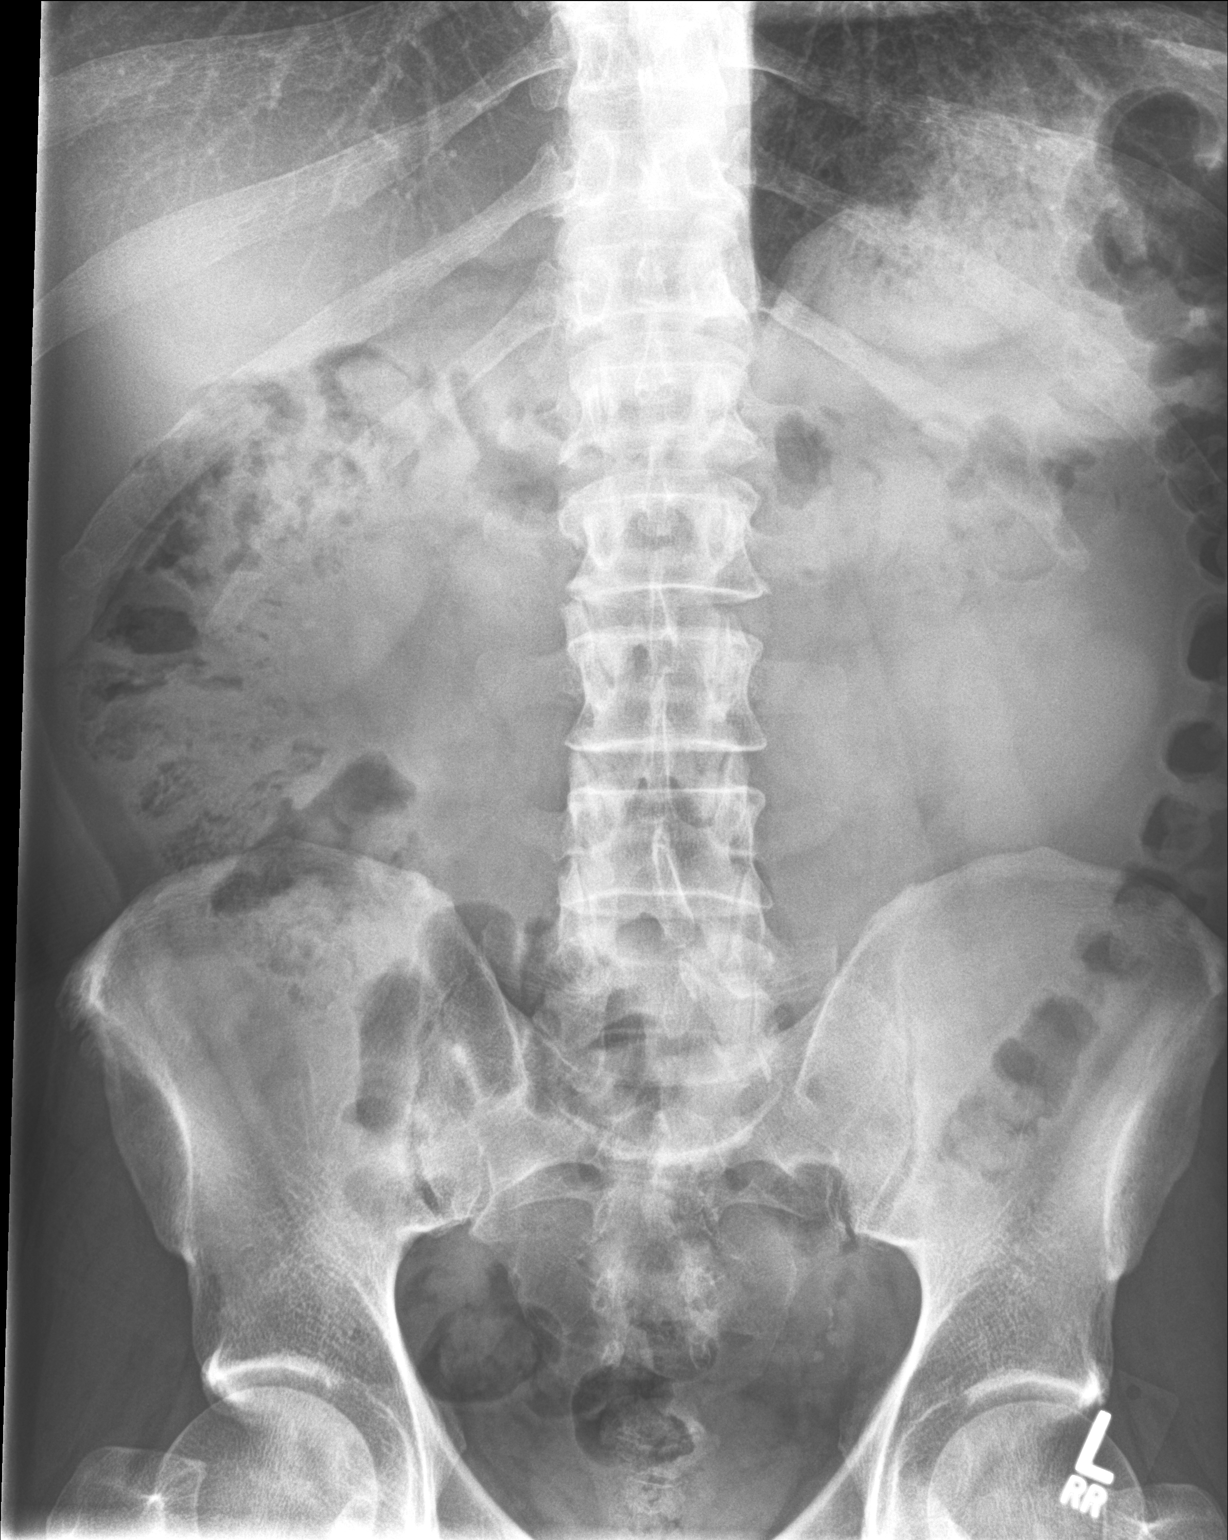

[3 of 3 positions shown; findings below may reference images not displayed]

FINDINGS: Cardiac shadow is within normal limits. The lungs are well aerated
bilaterally. No acute bony abnormality is seen.

Scattered large and small bowel gas is noted. A moderate amount of
retained fecal material is noted consistent with a degree of
constipation. No free air is seen. No abnormal mass or abnormal
calcifications are noted. No bony abnormality is seen.
IMPRESSION: Changes of mild constipation.  No other focal abnormality is seen.

## 2019-04-25 ENCOUNTER — Other Ambulatory Visit: Payer: Self-pay | Admitting: Emergency Medicine

## 2019-04-25 DIAGNOSIS — F419 Anxiety disorder, unspecified: Secondary | ICD-10-CM

## 2019-04-25 DIAGNOSIS — F329 Major depressive disorder, single episode, unspecified: Secondary | ICD-10-CM

## 2019-08-13 ENCOUNTER — Other Ambulatory Visit: Payer: Self-pay | Admitting: Emergency Medicine

## 2019-08-13 DIAGNOSIS — F411 Generalized anxiety disorder: Secondary | ICD-10-CM

## 2019-08-13 NOTE — Telephone Encounter (Signed)
Patient is requesting a refill of the following medications: Requested Prescriptions   Pending Prescriptions Disp Refills  . ALPRAZolam (XANAX) 0.25 MG tablet [Pharmacy Med Name: ALPRAZOLAM 0.25 MG TABLET] 30 tablet     Sig: TAKE 1 TABLET (0.25 MG TOTAL) BY MOUTH EVERY 8 (EIGHT) HOURS AS NEEDED FOR ANXIETY.    Date of patient request: 08/13/2019 Last office visit: 12/23/2018 Date of last refill: 12/23/2018 Last refill amount: 30 tablets one refill Follow up time period per chart: follow up appointment scheduled

## 2019-08-13 NOTE — Telephone Encounter (Signed)
Requested medication (s) are due for refill today: Yes  Requested medication (s) are on the active medication list: Yes  Last refill:  12/23/18  Future visit scheduled: Yes  Notes to clinic:  See request.    Requested Prescriptions  Pending Prescriptions Disp Refills   ALPRAZolam (XANAX) 0.25 MG tablet [Pharmacy Med Name: ALPRAZOLAM 0.25 MG TABLET] 30 tablet     Sig: Take 1 tablet (0.25 mg total) by mouth every 8 (eight) hours as needed for anxiety.      Not Delegated - Psychiatry:  Anxiolytics/Hypnotics Failed - 08/13/2019 12:56 PM      Failed - This refill cannot be delegated      Failed - Urine Drug Screen completed in last 360 days.      Failed - Valid encounter within last 6 months    Recent Outpatient Visits           7 months ago GAD (generalized anxiety disorder)   Primary Care at [facility], Nunda, MD   10 months ago GAD (generalized anxiety disorder)   Primary Care at [facility], Ines Bloomer, MD   10 months ago Dyslipidemia   Primary Care at [facility], Arlie Solomons, MD   1 year ago Encounter for general adult medical examination with abnormal findings   Primary Care at [facility], Ines Bloomer, MD   1 year ago Epigastric pain   Primary Care at [facility], Ines Bloomer, MD       Future Appointments             In 4 months [place], Ines Bloomer, MD Primary Care at [facility], [facility]

## 2019-11-25 ENCOUNTER — Other Ambulatory Visit: Payer: Self-pay

## 2019-11-25 ENCOUNTER — Ambulatory Visit (INDEPENDENT_AMBULATORY_CARE_PROVIDER_SITE_OTHER): Payer: PRIVATE HEALTH INSURANCE | Admitting: Family Medicine

## 2019-11-25 ENCOUNTER — Encounter: Payer: Self-pay | Admitting: Family Medicine

## 2019-11-25 VITALS — BP 163/117 | HR 90 | Temp 98.3°F | Ht 66.0 in | Wt 172.6 lb

## 2019-11-25 DIAGNOSIS — S39012A Strain of muscle, fascia and tendon of lower back, initial encounter: Secondary | ICD-10-CM

## 2019-11-25 LAB — POCT URINALYSIS DIP (MANUAL ENTRY)
Bilirubin, UA: NEGATIVE
Blood, UA: NEGATIVE
Glucose, UA: NEGATIVE mg/dL
Ketones, POC UA: NEGATIVE mg/dL
Leukocytes, UA: NEGATIVE
Nitrite, UA: NEGATIVE
Protein Ur, POC: NEGATIVE mg/dL
Spec Grav, UA: >=1.03 — AB (ref 1.010–1.025)
Urobilinogen, UA: 2 E.U./dL — AB
pH, UA: 5.5 (ref 5.0–8.0)

## 2019-11-25 MED ORDER — HYDROCODONE-ACETAMINOPHEN 5-325 MG PO TABS: 1.0000 | ORAL_TABLET | ORAL | 0 refills | Status: DC | PRN

## 2019-11-25 MED ORDER — DICLOFENAC SODIUM 75 MG PO TBEC: 75.0000 mg | DELAYED_RELEASE_TABLET | Freq: Two times a day (BID) | ORAL | 0 refills | Status: DC

## 2019-11-25 MED ORDER — METHOCARBAMOL 500 MG PO TABS: ORAL_TABLET | ORAL | 1 refills | Status: DC

## 2019-11-25 NOTE — Patient Instructions (Addendum)
This is a bad back strain and sprain.  It will take some time to gradually get back to being without pain.  Take caution to avoid reinjury.  Robaxin (methocarbamol) 500 mg 1 in the morning, 1 in the afternoon, and 1 or 2 at night for muscle relaxant.  Diclofenac 75 mg 1 twice daily for pain and inflammation.  This is in the class of medicine with ibuprofen and Aleve and they should not be taken while you are on this medicine.  For additional pain relief you can take Tylenol (acetaminophen) 500 mg 2 pills 3 times daily.  Do not exceed 3000 mg in 24 hours.  For severe pain you can take the hydrocodone pain pills.  However these have a little bit of acetaminophen in them, so for each hydrocodone you take, please take 1 less Tylenol in the 24-hour.  We do not want you to get excessive Tylenol in your system.  Return if not improving.  You might at that point have to get some physical therapy ordered.  Try to do some gentle back stretches, using some of those in the handout below  Back Exercises These exercises help to make your trunk and back strong. They also help to keep the lower back flexible. Doing these exercises can help to prevent back pain or lessen existing pain.  If you have back pain, try to do these exercises 2-3 times each day or as told by your doctor.  As you get better, do the exercises once each day. Repeat the exercises more often as told by your doctor.  To stop back pain from coming back, do the exercises once each day, or as told by your doctor. Exercises Single knee to chest Do these steps 3-5 times in a row for each leg: 1. Lie on your back on a firm bed or the floor with your legs stretched out. 2. Bring one knee to your chest. 3. Grab your knee or thigh with both hands and hold them it in place. 4. Pull on your knee until you feel a gentle stretch in your lower back or buttocks. 5. Keep doing the stretch for 10-30 seconds. 6. Slowly let go of your leg and  straighten it. Pelvic tilt Do these steps 5-10 times in a row: 1. Lie on your back on a firm bed or the floor with your legs stretched out. 2. Bend your knees so they point up to the ceiling. Your feet should be flat on the floor. 3. Tighten your lower belly (abdomen) muscles to press your lower back against the floor. This will make your tailbone point up to the ceiling instead of pointing down to your feet or the floor. 4. Stay in this position for 5-10 seconds while you gently tighten your muscles and breathe evenly. Cat-cow Do these steps until your lower back bends more easily: 1. Get on your hands and knees on a firm surface. Keep your hands under your shoulders, and keep your knees under your hips. You may put padding under your knees. 2. Let your head hang down toward your chest. Tighten (contract) the muscles in your belly. Point your tailbone toward the floor so your lower back becomes rounded like the back of a cat. 3. Stay in this position for 5 seconds. 4. Slowly lift your head. Let the muscles of your belly relax. Point your tailbone up toward the ceiling so your back forms a sagging arch like the back of a cow. 5. Stay in this  position for 5 seconds.  Press-ups Do these steps 5-10 times in a row: 1. Lie on your belly (face-down) on the floor. 2. Place your hands near your head, about shoulder-width apart. 3. While you keep your back relaxed and keep your hips on the floor, slowly straighten your arms to raise the top half of your body and lift your shoulders. Do not use your back muscles. You may change where you place your hands in order to make yourself more comfortable. 4. Stay in this position for 5 seconds. 5. Slowly return to lying flat on the floor.  Bridges Do these steps 10 times in a row: 1. Lie on your back on a firm surface. 2. Bend your knees so they point up to the ceiling. Your feet should be flat on the floor. Your arms should be flat at your sides, next to  your body. 3. Tighten your butt muscles and lift your butt off the floor until your waist is almost as high as your knees. If you do not feel the muscles working in your butt and the back of your thighs, slide your feet 1-2 inches farther away from your butt. 4. Stay in this position for 3-5 seconds. 5. Slowly lower your butt to the floor, and let your butt muscles relax. If this exercise is too easy, try doing it with your arms crossed over your chest. Belly crunches Do these steps 5-10 times in a row: 1. Lie on your back on a firm bed or the floor with your legs stretched out. 2. Bend your knees so they point up to the ceiling. Your feet should be flat on the floor. 3. Cross your arms over your chest. 4. Tip your chin a little bit toward your chest but do not bend your neck. 5. Tighten your belly muscles and slowly raise your chest just enough to lift your shoulder blades a tiny bit off of the floor. Avoid raising your body higher than that, because it can put too much stress on your low back. 6. Slowly lower your chest and your head to the floor. Back lifts Do these steps 5-10 times in a row: 1. Lie on your belly (face-down) with your arms at your sides, and rest your forehead on the floor. 2. Tighten the muscles in your legs and your butt. 3. Slowly lift your chest off of the floor while you keep your hips on the floor. Keep the back of your head in line with the curve in your back. Look at the floor while you do this. 4. Stay in this position for 3-5 seconds. 5. Slowly lower your chest and your face to the floor. Contact a doctor if:  Your back pain gets a lot worse when you do an exercise.  Your back pain does not get better 2 hours after you exercise. If you have any of these problems, stop doing the exercises. Do not do them again unless your doctor says it is okay. Get help right away if:  You have sudden, very bad back pain. If this happens, stop doing the exercises. Do not do  them again unless your doctor says it is okay. This information is not intended to replace advice given to you by your health care provider. Make sure you discuss any questions you have with your health care provider. Document Revised: 02/26/2018 Document Reviewed: 02/26/2018 Elsevier Patient Education  [employer].   If you have lab work done today you will be contacted with  your lab results within the next 2 weeks.  If you have not heard from [place] then please contact us. The fastest way to get your results is to register for My Chart.   IF you received an x-ray today, you will receive an invoice from [facility]. Please contact [facility] at [phone] with questions or concerns regarding your invoice.   IF you received labwork today, you will receive an invoice from [place]. Please contact [facility] at [phone] with questions or concerns regarding your invoice.   Our billing staff will not be able to assist you with questions regarding bills from these companies.  You will be contacted with the lab results as soon as they are available. The fastest way to get your results is to activate your My Chart account. Instructions are located on the last page of this paperwork. If you have not heard from [place] regarding the results in 2 weeks, please contact this office.

## 2019-11-25 NOTE — Progress Notes (Signed)
Patient ID: Manuel Lang, male    DOB: 05/14/1966  Age: 54 y.o. MRN: [id number]  Chief Complaint  Patient presents with  . Back Pain    Pt sytated that he has been experiencing lower back pian for the past 3 days. He stated that he did put a metal roofing on his mother n law house ober the weekend but not sure if that has anything to do with it.    Subjective:   Over the weekend the patient was helping his mother-in-law Manuel Lang who used to work here many years ago) putting a metal roof on her house.  He was sitting side settled on the roof a lot of the time doing work himself.  Also was up and down the ladder.  After that he started having back spasms.  Last night was terrible.  He is not able to get comfortable at night laying down.  It is on the left mid to low back that hurts the very most.  Has not had a lot of back problems in the past.  He works for a [employer], but does not have to do a lot of heavy lifting.  He has machines to do most of that.  Current allergies, medications, problem list, past/family and social histories reviewed.  Objective:  BP (!) 163/117 (BP Location: Left Arm, Patient Position: Sitting, Cuff Size: Normal)   Pulse 90   Temp 98.3 F (36.8 C) (Temporal)   Ht 5\' 6"  (1.676 m)   Wt 172 lb 9.6 oz (78.3 kg)   SpO2 98%   BMI 27.86 kg/m   Pleasant gentleman, alert and oriented.  Blood pressure elevation is noted.  He is tender in the left paraspinous muscles of his back.  Not much tenderness down the midline.  Anterior flexion hurts but touching back which hurts the most.  Rotation to the right hurts more than to the left.  Straight leg raise test completely negative.  Deep tendon reflexes brisk in the knees and ankles.  Assessment & Plan:   Assessment: 1. Lumbar strain, initial encounter       Plan: See instructions Orders Placed This Encounter  Procedures  . POCT urinalysis dipstick   .thji No orders of the defined types were placed in this  encounter.  Results for orders placed or performed in visit on 11/25/19  POCT urinalysis dipstick  Result Value Ref Range   Color, UA yellow yellow   Clarity, UA clear clear   Glucose, UA negative negative mg/dL   Bilirubin, UA negative negative   Ketones, POC UA negative negative mg/dL   Spec Grav, UA >=1.030 (A) 1.010 - 1.025   Blood, UA negative negative   pH, UA 5.5 5.0 - 8.0   Protein Ur, POC negative negative mg/dL   Urobilinogen, UA 2.0 (A) 0.2 or 1.0 E.U./dL   Nitrite, UA Negative Negative   Leukocytes, UA Negative Negative         Patient Instructions   This is a bad back strain and sprain.  It will take some time to gradually get back to being without pain.  Take caution to avoid reinjury.  Robaxin (methocarbamol) 500 mg 1 in the morning, 1 in the afternoon, and 1 or 2 at night for muscle relaxant.  Diclofenac 75 mg 1 twice daily for pain and inflammation.  This is in the class of medicine with ibuprofen and Aleve and they should not be taken while you are on this medicine.  For additional pain relief  you can take Tylenol (acetaminophen) 500 mg 2 pills 3 times daily.  Do not exceed 3000 mg in 24 hours.  For severe pain you can take the hydrocodone pain pills.  However these have a little bit of acetaminophen in them, so for each hydrocodone you take, please take 1 less Tylenol in the 24-hour.  We do not want you to get excessive Tylenol in your system.  Return if not improving.  You might at that point have to get some physical therapy ordered.  Try to do some gentle back stretches, using some of those in the handout below  Back Exercises These exercises help to make your trunk and back strong. They also help to keep the lower back flexible. Doing these exercises can help to prevent back pain or lessen existing pain.  If you have back pain, try to do these exercises 2-3 times each day or as told by your doctor.  As you get better, do the exercises once each day.  Repeat the exercises more often as told by your doctor.  To stop back pain from coming back, do the exercises once each day, or as told by your doctor. Exercises Single knee to chest Do these steps 3-5 times in a row for each leg: 1. Lie on your back on a firm bed or the floor with your legs stretched out. 2. Bring one knee to your chest. 3. Grab your knee or thigh with both hands and hold them it in place. 4. Pull on your knee until you feel a gentle stretch in your lower back or buttocks. 5. Keep doing the stretch for 10-30 seconds. 6. Slowly let go of your leg and straighten it. Pelvic tilt Do these steps 5-10 times in a row: 1. Lie on your back on a firm bed or the floor with your legs stretched out. 2. Bend your knees so they point up to the ceiling. Your feet should be flat on the floor. 3. Tighten your lower belly (abdomen) muscles to press your lower back against the floor. This will make your tailbone point up to the ceiling instead of pointing down to your feet or the floor. 4. Stay in this position for 5-10 seconds while you gently tighten your muscles and breathe evenly. Cat-cow Do these steps until your lower back bends more easily: 1. Get on your hands and knees on a firm surface. Keep your hands under your shoulders, and keep your knees under your hips. You may put padding under your knees. 2. Let your head hang down toward your chest. Tighten (contract) the muscles in your belly. Point your tailbone toward the floor so your lower back becomes rounded like the back of a cat. 3. Stay in this position for 5 seconds. 4. Slowly lift your head. Let the muscles of your belly relax. Point your tailbone up toward the ceiling so your back forms a sagging arch like the back of a cow. 5. Stay in this position for 5 seconds.  Press-ups Do these steps 5-10 times in a row: 1. Lie on your belly (face-down) on the floor. 2. Place your hands near your head, about shoulder-width  apart. 3. While you keep your back relaxed and keep your hips on the floor, slowly straighten your arms to raise the top half of your body and lift your shoulders. Do not use your back muscles. You may change where you place your hands in order to make yourself more comfortable. 4. Stay in this position  for 5 seconds. 5. Slowly return to lying flat on the floor.  Bridges Do these steps 10 times in a row: 1. Lie on your back on a firm surface. 2. Bend your knees so they point up to the ceiling. Your feet should be flat on the floor. Your arms should be flat at your sides, next to your body. 3. Tighten your butt muscles and lift your butt off the floor until your waist is almost as high as your knees. If you do not feel the muscles working in your butt and the back of your thighs, slide your feet 1-2 inches farther away from your butt. 4. Stay in this position for 3-5 seconds. 5. Slowly lower your butt to the floor, and let your butt muscles relax. If this exercise is too easy, try doing it with your arms crossed over your chest. Belly crunches Do these steps 5-10 times in a row: 1. Lie on your back on a firm bed or the floor with your legs stretched out. 2. Bend your knees so they point up to the ceiling. Your feet should be flat on the floor. 3. Cross your arms over your chest. 4. Tip your chin a little bit toward your chest but do not bend your neck. 5. Tighten your belly muscles and slowly raise your chest just enough to lift your shoulder blades a tiny bit off of the floor. Avoid raising your body higher than that, because it can put too much stress on your low back. 6. Slowly lower your chest and your head to the floor. Back lifts Do these steps 5-10 times in a row: 1. Lie on your belly (face-down) with your arms at your sides, and rest your forehead on the floor. 2. Tighten the muscles in your legs and your butt. 3. Slowly lift your chest off of the floor while you keep your hips on the  floor. Keep the back of your head in line with the curve in your back. Look at the floor while you do this. 4. Stay in this position for 3-5 seconds. 5. Slowly lower your chest and your face to the floor. Contact a doctor if:  Your back pain gets a lot worse when you do an exercise.  Your back pain does not get better 2 hours after you exercise. If you have any of these problems, stop doing the exercises. Do not do them again unless your doctor says it is okay. Get help right away if:  You have sudden, very bad back pain. If this happens, stop doing the exercises. Do not do them again unless your doctor says it is okay. This information is not intended to replace advice given to you by your health care provider. Make sure you discuss any questions you have with your health care provider. Document Revised: 02/26/2018 Document Reviewed: 02/26/2018 Elsevier Patient Education  [employer].   If you have lab work done today you will be contacted with your lab results within the next 2 weeks.  If you have not heard from [place] then please contact us. The fastest way to get your results is to register for My Chart.   IF you received an x-ray today, you will receive an invoice from [facility]. Please contact [facility] at [phone] with questions or concerns regarding your invoice.   IF you received labwork today, you will receive an invoice from [place]. Please contact [facility] at [phone] with questions or concerns regarding your invoice.  Our billing staff will not be able to assist you with questions regarding bills from these companies.  You will be contacted with the lab results as soon as they are available. The fastest way to get your results is to activate your My Chart account. Instructions are located on the last page of this paperwork. If you have not heard from [place] regarding the results in 2 weeks, please contact this office.        Return if  symptoms worsen or fail to improve.   Ruben Reason, MD 11/25/2019

## 2019-12-07 ENCOUNTER — Other Ambulatory Visit: Payer: Self-pay | Admitting: Emergency Medicine

## 2019-12-07 ENCOUNTER — Other Ambulatory Visit: Payer: Self-pay

## 2019-12-07 DIAGNOSIS — E785 Hyperlipidemia, unspecified: Secondary | ICD-10-CM

## 2019-12-07 NOTE — Telephone Encounter (Signed)
Requested medications are due for refill today?  Yes  Requested medications are on active medication list?  Yes  Last Refill:   12/23/2018  # 90 with 3 refills   Future visit scheduled?  Yes   Notes to Clinic:  Medication failed RX refill protocol due to no labs within the past 360 days.  Last labs were performed on 09/18/2018.

## 2019-12-22 ENCOUNTER — Ambulatory Visit (INDEPENDENT_AMBULATORY_CARE_PROVIDER_SITE_OTHER): Payer: PRIVATE HEALTH INSURANCE | Admitting: Emergency Medicine

## 2019-12-22 ENCOUNTER — Encounter: Payer: Self-pay | Admitting: Emergency Medicine

## 2019-12-22 ENCOUNTER — Other Ambulatory Visit: Payer: Self-pay

## 2019-12-22 VITALS — BP 126/86 | HR 82 | Temp 98.1°F | Ht 65.0 in | Wt 169.4 lb

## 2019-12-22 DIAGNOSIS — F17209 Nicotine dependence, unspecified, with unspecified nicotine-induced disorders: Secondary | ICD-10-CM | POA: Diagnosis not present

## 2019-12-22 DIAGNOSIS — E785 Hyperlipidemia, unspecified: Secondary | ICD-10-CM | POA: Diagnosis not present

## 2019-12-22 DIAGNOSIS — Z1211 Encounter for screening for malignant neoplasm of colon: Secondary | ICD-10-CM

## 2019-12-22 DIAGNOSIS — F411 Generalized anxiety disorder: Secondary | ICD-10-CM | POA: Diagnosis not present

## 2019-12-22 MED ORDER — DULOXETINE HCL 30 MG PO CPEP: ORAL_CAPSULE | ORAL | 3 refills | Status: DC

## 2019-12-22 MED ORDER — ROSUVASTATIN CALCIUM 20 MG PO TABS: 20.0000 mg | ORAL_TABLET | Freq: Every day | ORAL | 3 refills | Status: DC

## 2019-12-22 NOTE — Progress Notes (Signed)
Manuel Lang 54 y.o.   Chief Complaint  Patient presents with   Medical Management of Chronic Issues    f/u     HISTORY OF PRESENT ILLNESS: This is a 54 y.o. male here for follow-up of chronic medical problems: 1.  Generalized anxiety disorder: On Cymbalta.  Not using Xanax. 2.  Dyslipidemia: On rosuvastatin 20 mg daily. 3.  Chronic smoker Not up-to-date with colonoscopy.  Will refer to GI. No other complaints or medical concerns today. Depression screen [facility] 2/9 12/22/2019 11/25/2019 12/23/2018 09/24/2018 01/24/2018  Decreased Interest 0 0 0 0 0  Down, Depressed, Hopeless 0 0 0 0 0  PHQ - 2 Score 0 0 0 0 0  Altered sleeping - - - - -  Tired, decreased energy - - - - -  Change in appetite - - - - -  Feeling bad or failure about yourself  - - - - -  Trouble concentrating - - - - -  Moving slowly or fidgety/restless - - - - -  Suicidal thoughts - - - - -  PHQ-9 Score - - - - -  Difficult doing work/chores - - - - -  Fully vaccinated against Covid.   HPI   Prior to Admission medications   Medication Sig Start Date End Date Taking? Authorizing Provider  ALPRAZolam (XANAX) 0.25 MG tablet TAKE 1 TABLET (0.25 MG TOTAL) BY MOUTH EVERY 8 (EIGHT) HOURS AS NEEDED FOR ANXIETY. 08/14/19  Yes Rhylin Venters, Ines Bloomer, MD  DULoxetine (CYMBALTA) 30 MG capsule TAKE 1 CAPSULE BY MOUTH EVERY DAY 12/23/18  Yes Carson Bogden, Ines Bloomer, MD  rosuvastatin (CRESTOR) 20 MG tablet TAKE 1 TABLET BY MOUTH EVERY DAY 12/07/19  Yes Callie Facey, Ines Bloomer, MD  diclofenac (VOLTAREN) 75 MG EC tablet Take 1 tablet (75 mg total) by mouth 2 (two) times daily. Patient not taking: Reported on 12/22/2019 11/25/19   Posey Boyer, MD    No Known Allergies  Patient Active Problem List   Diagnosis Date Noted   GAD (generalized anxiety disorder) 01/24/2018   Anxiety state 06/21/2016   Nephrolithiasis 11/09/2013   Nicotine addiction 11/09/2013   Depression 04/08/2013    Past Medical History:  Diagnosis Date    Depression     Past Surgical History:  Procedure Laterality Date   HAND SURGERY  2010   Left    Social History   Socioeconomic History   Marital status: Married    Spouse name: [name]   Number of children: 3   Years of education: Not on file   Highest education level: Not on file  Occupational History   Occupation: Production manager: [employer]  Tobacco Use   Smoking status: Current Every Day Smoker   Smokeless tobacco: Never Used  Substance and Sexual Activity   Alcohol use: No   Drug use: No   Sexual activity: Not on file  Other Topics Concern   Not on file  Social History Narrative   Patient lives at home with his wife and children, 1 grandson.   Patient works at [employer], full-time.   Patient is married to [place].   Patient drinks 4-20oz of [facility].   Patient is right-handed.   Patient has 3 children.   Patient has a 7th grade education.   Social Determinants of Health   Financial Resource Strain:    Difficulty of Paying Living Expenses:   Food Insecurity:    Worried About Charity fundraiser in the Last Year:  Ran Out of Food in the Last Year:   Transportation Needs:    Film/video editor (Medical):    Lack of Transportation (Non-Medical):   Physical Activity:    Days of Exercise per Week:    Minutes of Exercise per Session:   Stress:    Feeling of Stress :   Social Connections:    Frequency of Communication with Friends and Family:    Frequency of Social Gatherings with Friends and Family:    Attends Religious Services:    Active Member of Clubs or Organizations:    Attends Music therapist:    Marital Status:   Intimate Partner Violence:    Fear of Current or Ex-Partner:    Emotionally Abused:    Physically Abused:    Sexually Abused:     History reviewed. No pertinent family history.   Review of Systems  Constitutional: Negative.  Negative for chills and  fever.  HENT: Negative.  Negative for congestion and sore throat.   Respiratory: Negative.  Negative for cough and shortness of breath.   Cardiovascular: Negative.  Negative for chest pain and palpitations.  Gastrointestinal: Negative.  Negative for abdominal pain, diarrhea, nausea and vomiting.  Genitourinary: Negative.  Negative for dysuria and hematuria.  Skin: Negative.  Negative for rash.  Neurological: Negative.  Negative for dizziness and headaches.  Endo/Heme/Allergies: Negative.   All other systems reviewed and are negative.    Today's Vitals   12/22/19 1548  BP: 126/86  Pulse: 82  Temp: 98.1 F (36.7 C)  TempSrc: Temporal  SpO2: 97%  Weight: 169 lb 6.4 oz (76.8 kg)  Height: 5\' 5"  (1.651 m)   Body mass index is 28.19 kg/m.   Physical Exam Vitals reviewed.  Constitutional:      Appearance: Normal appearance.  HENT:     Head: Normocephalic.  Eyes:     Extraocular Movements: Extraocular movements intact.     Conjunctiva/sclera: Conjunctivae normal.     Pupils: Pupils are equal, round, and reactive to light.  Cardiovascular:     Rate and Rhythm: Normal rate and regular rhythm.     Pulses: Normal pulses.     Heart sounds: Normal heart sounds.  Pulmonary:     Effort: Pulmonary effort is normal.     Breath sounds: Normal breath sounds.  Musculoskeletal:        General: Normal range of motion.     Cervical back: Normal range of motion and neck supple.  Skin:    General: Skin is warm and dry.     Capillary Refill: Capillary refill takes less than 2 seconds.  Neurological:     General: No focal deficit present.     Mental Status: He is alert and oriented to person, place, and time.  Psychiatric:        Mood and Affect: Mood normal.        Behavior: Behavior normal.    A total of 30 minutes was spent with the patient, greater than 50% of which was in counseling/coordination of care regarding multiple chronic medical problems under management, review of all  medications and side effects, review of most recent office visit notes, review of most recent blood work results, smoking and cardiovascular risks associated with this, smoking cessation advise, need for fasting blood work, prognosis and need for follow-up.   ASSESSMENT & PLAN: [place] was seen today for medical management of chronic issues.  Diagnoses and all orders for this visit:  GAD (generalized anxiety disorder) -  DULoxetine (CYMBALTA) 30 MG capsule; TAKE 1 CAPSULE BY MOUTH EVERY DAY  Dyslipidemia -     Lipid panel; Future -     Comprehensive metabolic panel; Future -     rosuvastatin (CRESTOR) 20 MG tablet; Take 1 tablet (20 mg total) by mouth daily.  Colon cancer screening -     Ambulatory referral to Gastroenterology  Tobacco use disorder, continuous    Patient Instructions       If you have lab work done today you will be contacted with your lab results within the next 2 weeks.  If you have not heard from [place] then please contact us. The fastest way to get your results is to register for My Chart.   IF you received an x-ray today, you will receive an invoice from [facility]. Please contact [facility] at [phone] with questions or concerns regarding your invoice.   IF you received labwork today, you will receive an invoice from [place]. Please contact [facility] at [phone] with questions or concerns regarding your invoice.   Our billing staff will not be able to assist you with questions regarding bills from these companies.  You will be contacted with the lab results as soon as they are available. The fastest way to get your results is to activate your My Chart account. Instructions are located on the last page of this paperwork. If you have not heard from [place] regarding the results in 2 weeks, please contact this office.      Health Maintenance, Male Adopting a healthy lifestyle and getting preventive care are important in promoting  health and wellness. Ask your health care provider about:  The right schedule for you to have regular tests and exams.  Things you can do on your own to prevent diseases and keep yourself healthy. What should I know about diet, weight, and exercise? Eat a healthy diet   Eat a diet that includes plenty of vegetables, fruits, low-fat dairy products, and lean protein.  Do not eat a lot of foods that are high in solid fats, added sugars, or sodium. Maintain a healthy weight Body mass index (BMI) is a measurement that can be used to identify possible weight problems. It estimates body fat based on height and weight. Your health care provider can help determine your BMI and help you achieve or maintain a healthy weight. Get regular exercise Get regular exercise. This is one of the most important things you can do for your health. Most adults should:  Exercise for at least 150 minutes each week. The exercise should increase your heart rate and make you sweat (moderate-intensity exercise).  Do strengthening exercises at least twice a week. This is in addition to the moderate-intensity exercise.  Spend less time sitting. Even light physical activity can be beneficial. Watch cholesterol and blood lipids Have your blood tested for lipids and cholesterol at 52 years of age, then have this test every 5 years. You may need to have your cholesterol levels checked more often if:  Your lipid or cholesterol levels are high.  You are older than 52 years of age.  You are at high risk for heart disease. What should I know about cancer screening? Many types of cancers can be detected early and may often be prevented. Depending on your health history and family history, you may need to have cancer screening at various ages. This may include screening for:  Colorectal cancer.  Prostate cancer.  Skin cancer.  Lung cancer. What should  I know about heart disease, diabetes, and high blood  pressure? Blood pressure and heart disease  High blood pressure causes heart disease and increases the risk of stroke. This is more likely to develop in people who have high blood pressure readings, are of African descent, or are overweight.  Talk with your health care provider about your target blood pressure readings.  Have your blood pressure checked: ? Every 3-5 years if you are 63-31 years of age. ? Every year if you are 3 years old or older.  If you are between the ages of 19 and 67 and are a current or former smoker, ask your health care provider if you should have a one-time screening for abdominal aortic aneurysm (AAA). Diabetes Have regular diabetes screenings. This checks your fasting blood sugar level. Have the screening done:  Once every three years after age 73 if you are at a normal weight and have a low risk for diabetes.  More often and at a younger age if you are overweight or have a high risk for diabetes. What should I know about preventing infection? Hepatitis B If you have a higher risk for hepatitis B, you should be screened for this virus. Talk with your health care provider to find out if you are at risk for hepatitis B infection. Hepatitis C Blood testing is recommended for:  Everyone born from [birth year] through 1963.  Anyone with known risk factors for hepatitis C. Sexually transmitted infections (STIs)  You should be screened each year for STIs, including gonorrhea and chlamydia, if: ? You are sexually active and are younger than 52 years of age. ? You are older than 52 years of age and your health care provider tells you that you are at risk for this type of infection. ? Your sexual activity has changed since you were last screened, and you are at increased risk for chlamydia or gonorrhea. Ask your health care provider if you are at risk.  Ask your health care provider about whether you are at high risk for HIV. Your health care provider may recommend a  prescription medicine to help prevent HIV infection. If you choose to take medicine to prevent HIV, you should first get tested for HIV. You should then be tested every 3 months for as long as you are taking the medicine. Follow these instructions at home: Lifestyle  Do not use any products that contain nicotine or tobacco, such as cigarettes, e-cigarettes, and chewing tobacco. If you need help quitting, ask your health care provider.  Do not use street drugs.  Do not share needles.  Ask your health care provider for help if you need support or information about quitting drugs. Alcohol use  Do not drink alcohol if your health care provider tells you not to drink.  If you drink alcohol: ? Limit how much you have to 0-2 drinks a day. ? Be aware of how much alcohol is in your drink. In the [place] one drink equals one 12 oz bottle of beer (355 mL), one 5 oz glass of wine (148 mL), or one 1 oz glass of hard liquor (44 mL). General instructions  Schedule regular health, dental, and eye exams.  Stay current with your vaccines.  Tell your health care provider if: ? You often feel depressed. ? You have ever been abused or do not feel safe at home. Summary  Adopting a healthy lifestyle and getting preventive care are important in promoting health and wellness.  Follow your health  care provider's instructions about healthy diet, exercising, and getting tested or screened for diseases.  Follow your health care provider's instructions on monitoring your cholesterol and blood pressure. This information is not intended to replace advice given to you by your health care provider. Make sure you discuss any questions you have with your health care provider. Document Revised: 05/27/2018 Document Reviewed: 05/27/2018 Elsevier Patient Education  2020 Elsevier Inc.      Agustina Caroli, MD [facility]

## 2019-12-22 NOTE — Patient Instructions (Addendum)
   If you have lab work done today you will be contacted with your lab results within the next 2 weeks.  If you have not heard from us then please contact us. The fastest way to get your results is to register for My Chart.   IF you received an x-ray today, you will receive an invoice from [facility]. Please contact [facility] at [phone] with questions or concerns regarding your invoice.   IF you received labwork today, you will receive an invoice from [facility]. Please contact [facility] at [phone] with questions or concerns regarding your invoice.   Our billing staff will not be able to assist you with questions regarding bills from these companies.  You will be contacted with the lab results as soon as they are available. The fastest way to get your results is to activate your My Chart account. Instructions are located on the last page of this paperwork. If you have not heard from us regarding the results in 2 weeks, please contact this office.      Health Maintenance, Male Adopting a healthy lifestyle and getting preventive care are important in promoting health and wellness. Ask your health care provider about:  The right schedule for you to have regular tests and exams.  Things you can do on your own to prevent diseases and keep yourself healthy. What should I know about diet, weight, and exercise? Eat a healthy diet   Eat a diet that includes plenty of vegetables, fruits, low-fat dairy products, and lean protein.  Do not eat a lot of foods that are high in solid fats, added sugars, or sodium. Maintain a healthy weight Body mass index (BMI) is a measurement that can be used to identify possible weight problems. It estimates body fat based on height and weight. Your health care provider can help determine your BMI and help you achieve or maintain a healthy weight. Get regular exercise Get regular exercise. This is one of the most important things you  can do for your health. Most adults should:  Exercise for at least 150 minutes each week. The exercise should increase your heart rate and make you sweat (moderate-intensity exercise).  Do strengthening exercises at least twice a week. This is in addition to the moderate-intensity exercise.  Spend less time sitting. Even light physical activity can be beneficial. Watch cholesterol and blood lipids Have your blood tested for lipids and cholesterol at 52 years of age, then have this test every 5 years. You may need to have your cholesterol levels checked more often if:  Your lipid or cholesterol levels are high.  You are older than 52 years of age.  You are at high risk for heart disease. What should I know about cancer screening? Many types of cancers can be detected early and may often be prevented. Depending on your health history and family history, you may need to have cancer screening at various ages. This may include screening for:  Colorectal cancer.  Prostate cancer.  Skin cancer.  Lung cancer. What should I know about heart disease, diabetes, and high blood pressure? Blood pressure and heart disease  High blood pressure causes heart disease and increases the risk of stroke. This is more likely to develop in people who have high blood pressure readings, are of African descent, or are overweight.  Talk with your health care provider about your target blood pressure readings.  Have your blood pressure checked: ? Every 3-5 years if you are 18-39   years of age. ? Every year if you are 40 years old or older.  If you are between the ages of 65 and 75 and are a current or former smoker, ask your health care provider if you should have a one-time screening for abdominal aortic aneurysm (AAA). Diabetes Have regular diabetes screenings. This checks your fasting blood sugar level. Have the screening done:  Once every three years after age 45 if you are at a normal weight and have  a low risk for diabetes.  More often and at a younger age if you are overweight or have a high risk for diabetes. What should I know about preventing infection? Hepatitis B If you have a higher risk for hepatitis B, you should be screened for this virus. Talk with your health care provider to find out if you are at risk for hepatitis B infection. Hepatitis C Blood testing is recommended for:  Everyone born from 1945 through 1965.  Anyone with known risk factors for hepatitis C. Sexually transmitted infections (STIs)  You should be screened each year for STIs, including gonorrhea and chlamydia, if: ? You are sexually active and are younger than 52 years of age. ? You are older than 52 years of age and your health care provider tells you that you are at risk for this type of infection. ? Your sexual activity has changed since you were last screened, and you are at increased risk for chlamydia or gonorrhea. Ask your health care provider if you are at risk.  Ask your health care provider about whether you are at high risk for HIV. Your health care provider may recommend a prescription medicine to help prevent HIV infection. If you choose to take medicine to prevent HIV, you should first get tested for HIV. You should then be tested every 3 months for as long as you are taking the medicine. Follow these instructions at home: Lifestyle  Do not use any products that contain nicotine or tobacco, such as cigarettes, e-cigarettes, and chewing tobacco. If you need help quitting, ask your health care provider.  Do not use street drugs.  Do not share needles.  Ask your health care provider for help if you need support or information about quitting drugs. Alcohol use  Do not drink alcohol if your health care provider tells you not to drink.  If you drink alcohol: ? Limit how much you have to 0-2 drinks a day. ? Be aware of how much alcohol is in your drink. In the [place] one drink equals one 12  oz bottle of beer (355 mL), one 5 oz glass of wine (148 mL), or one 1 oz glass of hard liquor (44 mL). General instructions  Schedule regular health, dental, and eye exams.  Stay current with your vaccines.  Tell your health care provider if: ? You often feel depressed. ? You have ever been abused or do not feel safe at home. Summary  Adopting a healthy lifestyle and getting preventive care are important in promoting health and wellness.  Follow your health care provider's instructions about healthy diet, exercising, and getting tested or screened for diseases.  Follow your health care provider's instructions on monitoring your cholesterol and blood pressure. This information is not intended to replace advice given to you by your health care provider. Make sure you discuss any questions you have with your health care provider. Document Revised: 05/27/2018 Document Reviewed: 05/27/2018 Elsevier Patient Education  2020 Elsevier Inc.  

## 2020-02-09 ENCOUNTER — Other Ambulatory Visit: Payer: Self-pay | Admitting: Family Medicine

## 2020-02-09 DIAGNOSIS — S39012A Strain of muscle, fascia and tendon of lower back, initial encounter: Secondary | ICD-10-CM

## 2020-03-20 ENCOUNTER — Other Ambulatory Visit: Payer: Self-pay | Admitting: Family Medicine

## 2020-03-20 DIAGNOSIS — S39012A Strain of muscle, fascia and tendon of lower back, initial encounter: Secondary | ICD-10-CM

## 2020-03-20 NOTE — Telephone Encounter (Signed)
Requested Prescriptions  Pending Prescriptions Disp Refills   diclofenac (VOLTAREN) 75 MG EC tablet [Pharmacy Med Name: DICLOFENAC SOD EC 75 MG TAB] 90 tablet 0    Sig: TAKE 1 TABLET BY MOUTH TWICE A DAY     Analgesics:  NSAIDS Failed - 03/20/2020  1:31 AM      Failed - Cr in normal range and within 360 days    Creat  Date Value Ref Range Status  11/09/2013 0.97 0.50 - 1.35 mg/dL Final   Creatinine, Ser  Date Value Ref Range Status  09/18/2018 0.77 0.76 - 1.27 mg/dL Final         Failed - HGB in normal range and within 360 days    Hemoglobin  Date Value Ref Range Status  01/09/2018 15.1 14.1 - 18.1 g/dL Final  08/01/2008 14.7 13.0 - 17.0 g/dL Final         Passed - Patient is not pregnant      Passed - Valid encounter within last 12 months    Recent Outpatient Visits          2 months ago GAD (generalized anxiety disorder)   [facility] at [place], Ines Bloomer, MD   3 months ago Lumbar strain, initial encounter   [facility] Care at [facility], Fenton Malling, MD   1 year ago GAD (generalized anxiety disorder)   Primary Care at [facility], Ines Bloomer, MD   1 year ago GAD (generalized anxiety disorder)   Primary Care at [facility], Ines Bloomer, MD   1 year ago Dyslipidemia   Primary Care at [facility], Arlie Solomons, MD      Future Appointments            In 9 months Sagardia, Ines Bloomer, MD Primary [facility] at [place], [facility]

## 2020-04-29 ENCOUNTER — Other Ambulatory Visit: Payer: Self-pay | Admitting: Emergency Medicine

## 2020-04-29 DIAGNOSIS — S39012A Strain of muscle, fascia and tendon of lower back, initial encounter: Secondary | ICD-10-CM

## 2020-04-29 NOTE — Telephone Encounter (Signed)
Requested Prescriptions  Pending Prescriptions Disp Refills  . diclofenac (VOLTAREN) 75 MG EC tablet [Pharmacy Med Name: DICLOFENAC SOD EC 75 MG TAB] 90 tablet 0    Sig: TAKE 1 TABLET BY MOUTH TWICE A DAY     Analgesics:  NSAIDS Failed - 04/29/2020  9:12 AM      Failed - Cr in normal range and within 360 days    Creat  Date Value Ref Range Status  11/09/2013 0.97 0.50 - 1.35 mg/dL Final   Creatinine, Ser  Date Value Ref Range Status  09/18/2018 0.77 0.76 - 1.27 mg/dL Final         Failed - HGB in normal range and within 360 days    Hemoglobin  Date Value Ref Range Status  01/09/2018 15.1 14.1 - 18.1 g/dL Final  08/01/2008 14.7 13.0 - 17.0 g/dL Final         Passed - Patient is not pregnant      Passed - Valid encounter within last 12 months    Recent Outpatient Visits          4 months ago GAD (generalized anxiety disorder)   [facility] at [place], Ines Bloomer, MD   5 months ago Lumbar strain, initial encounter   Primary Care at [facility], Fenton Malling, MD   1 year ago GAD (generalized anxiety disorder)   Primary Care at [facility], Ines Bloomer, MD   1 year ago GAD (generalized anxiety disorder)   Primary Care at [facility], Ines Bloomer, MD   1 year ago Dyslipidemia   Primary Care at [facility], Arlie Solomons, MD      Future Appointments            In 8 months Sagardia, Ines Bloomer, MD Primary Care at [place], [facility]

## 2020-06-08 ENCOUNTER — Other Ambulatory Visit: Payer: Self-pay | Admitting: Emergency Medicine

## 2020-06-08 DIAGNOSIS — S39012A Strain of muscle, fascia and tendon of lower back, initial encounter: Secondary | ICD-10-CM

## 2020-06-08 NOTE — Telephone Encounter (Signed)
Requested medications are due for refill today yes  Requested medications are on the active medication list yes  Last refill 11/13  Last visit 11/2019  Future visit scheduled 12/2020  Notes to clinic Was given a 37 day rx recently, unsure if MD wants to see earlier than next visit in July 2022.

## 2020-06-08 NOTE — Telephone Encounter (Signed)
Patient is requesting a refill of the following medications: Requested Prescriptions   Pending Prescriptions Disp Refills  . diclofenac (VOLTAREN) 75 MG EC tablet [Pharmacy Med Name: DICLOFENAC SOD EC 75 MG TAB] 90 tablet 0    Sig: TAKE 1 TABLET BY MOUTH TWICE A DAY    Date of patient request: 06/08/20 Last office visit: 11/2019 Date of last refill:04/29/20 Last refill amount: 90 Follow up time period per chart: 12/25/20

## 2020-10-16 ENCOUNTER — Other Ambulatory Visit: Payer: Self-pay

## 2020-10-18 ENCOUNTER — Other Ambulatory Visit: Payer: Self-pay

## 2020-10-18 ENCOUNTER — Encounter: Payer: Self-pay | Admitting: Emergency Medicine

## 2020-10-18 ENCOUNTER — Ambulatory Visit: Payer: PRIVATE HEALTH INSURANCE | Admitting: Emergency Medicine

## 2020-10-18 VITALS — BP 120/76 | HR 92 | Temp 98.8°F | Ht 65.0 in | Wt 167.4 lb

## 2020-10-18 DIAGNOSIS — R29818 Other symptoms and signs involving the nervous system: Secondary | ICD-10-CM | POA: Insufficient documentation

## 2020-10-18 DIAGNOSIS — Z1211 Encounter for screening for malignant neoplasm of colon: Secondary | ICD-10-CM

## 2020-10-18 DIAGNOSIS — J329 Chronic sinusitis, unspecified: Secondary | ICD-10-CM | POA: Diagnosis not present

## 2020-10-18 DIAGNOSIS — F411 Generalized anxiety disorder: Secondary | ICD-10-CM

## 2020-10-18 DIAGNOSIS — F17200 Nicotine dependence, unspecified, uncomplicated: Secondary | ICD-10-CM

## 2020-10-18 NOTE — Patient Instructions (Signed)

## 2020-10-18 NOTE — Progress Notes (Signed)
Manuel Lang 55 y.o.   Chief Complaint  Patient presents with  . Medication Problem    Per patient will discuss    HISTORY OF PRESENT ILLNESS: A1A This is a 55 y.o. male complaining of several symptoms for several months: Chronic watery eyes with chronic mostly left-sided sinus congestion. Feeling "sleepy" most of the time.  Has general tiredness.  Does not wake up refreshed even though he sleeps 6 to 8 hours per night. Accompanied today by wife who states patient snores and has "jerking movements" when he sleeps.  She describes what sounds like sleep apnea episodes. Chronic smoker half a pack to 1 pack a day. Has history of depression/anxiety.  Takes Cymbalta 30 mg daily. Has history of dyslipidemia on rosuvastatin 20 mg daily. No other complaints or medical concerns today.  HPI   Prior to Admission medications   Medication Sig Start Date End Date Taking? Authorizing Provider  ALPRAZolam (XANAX) 0.25 MG tablet TAKE 1 TABLET (0.25 MG TOTAL) BY MOUTH EVERY 8 (EIGHT) HOURS AS NEEDED FOR ANXIETY. Patient not taking: Reported on 10/18/2020 08/14/19   Horald Pollen, MD  diclofenac (VOLTAREN) 75 MG EC tablet TAKE 1 TABLET BY MOUTH TWICE A DAY Patient not taking: Reported on 10/18/2020 06/08/20   Horald Pollen, MD  DULoxetine (CYMBALTA) 30 MG capsule TAKE 1 CAPSULE BY MOUTH EVERY DAY Patient not taking: Reported on 10/18/2020 12/22/19   Horald Pollen, MD  rosuvastatin (CRESTOR) 20 MG tablet Take 1 tablet (20 mg total) by mouth daily. 12/22/19 03/21/20  Horald Pollen, MD    No Known Allergies  Patient Active Problem List   Diagnosis Date Noted  . GAD (generalized anxiety disorder) 01/24/2018  . Anxiety state 06/21/2016  . Nephrolithiasis 11/09/2013  . Nicotine addiction 11/09/2013  . Depression 04/08/2013    Past Medical History:  Diagnosis Date  . Depression     Past Surgical History:  Procedure Laterality Date  . HAND SURGERY  2010   Left     Social History   Socioeconomic History  . Marital status: Married    Spouse name: [name]  . Number of children: 3  . Years of education: Not on file  . Highest education level: Not on file  Occupational History  . Occupation: Production manager: [employer]  Tobacco Use  . Smoking status: Current Every Day Smoker  . Smokeless tobacco: Never Used  Substance and Sexual Activity  . Alcohol use: No  . Drug use: No  . Sexual activity: Not on file  Other Topics Concern  . Not on file  Social History Narrative   Patient lives at home with his wife and children, 1 grandson.   Patient works at [employer], full-time.   Patient is married to [place].   Patient drinks 4-20oz of [facility].   Patient is right-handed.   Patient has 3 children.   Patient has a 7th grade education.   Social Determinants of Health   Financial Resource Strain: Not on file  Food Insecurity: Not on file  Transportation Needs: Not on file  Physical Activity: Not on file  Stress: Not on file  Social Connections: Not on file  Intimate Partner Violence: Not on file    No family history on file.   Review of Systems  Constitutional: Negative.  Negative for chills and fever.  HENT: Positive for congestion. Negative for ear discharge, ear pain, nosebleeds and sore throat.   Eyes: Negative for blurred vision,  double vision, pain and redness.       Watery eyes  Respiratory: Negative.  Negative for cough and shortness of breath.   Cardiovascular: Negative.  Negative for chest pain and palpitations.  Gastrointestinal: Negative.  Negative for abdominal pain, diarrhea, nausea and vomiting.  Genitourinary: Negative.  Negative for dysuria and hematuria.  Musculoskeletal: Negative.  Negative for myalgias.  Skin: Negative.  Negative for rash.  Neurological: Negative.  Negative for dizziness and headaches.  All other systems reviewed and are negative.  Vitals:   10/18/20 1539  BP:  120/76  Pulse: 92  Temp: 98.8 F (37.1 C)  SpO2: 97%     Physical Exam Constitutional:      Appearance: Normal appearance.  HENT:     Head: Normocephalic.     Nose: Nose normal.     Mouth/Throat:     Mouth: Mucous membranes are moist.     Pharynx: Oropharynx is clear.  Eyes:     Extraocular Movements: Extraocular movements intact.     Conjunctiva/sclera: Conjunctivae normal.     Pupils: Pupils are equal, round, and reactive to light.  Cardiovascular:     Rate and Rhythm: Normal rate and regular rhythm.     Pulses: Normal pulses.     Heart sounds: Normal heart sounds.  Pulmonary:     Effort: Pulmonary effort is normal.     Breath sounds: Normal breath sounds.  Musculoskeletal:        General: Normal range of motion.     Cervical back: Normal range of motion and neck supple.  Skin:    General: Skin is warm and dry.     Capillary Refill: Capillary refill takes less than 2 seconds.  Neurological:     General: No focal deficit present.     Mental Status: He is alert and oriented to person, place, and time.  Psychiatric:        Mood and Affect: Mood normal.        Behavior: Behavior normal.      ASSESSMENT & PLAN: A total of 30 minutes was spent with the patient and counseling/coordination of care regarding possibility of sleep apnea diagnosis and need to get sleep studies done, cardiovascular risks associated with smoking, smoking cessation advise, education on nutrition, chronic sinusitis and treatment, review of most recent office visit notes, dyslipidemia and need to continue rosuvastatin, health maintenance items including need to screen for colon cancer with colonoscopy, generalized anxiety disorder and need to continue Cymbalta, documentation, prognosis, and need for follow-up.  Chronic sinusitis Exacerbated by smoking.  No seasonal component. Advised to use over-the-counter decongestants, antihistamines, and nasal sprays.  Sudafed, Zyrtec, and or Flonase.  GAD  (generalized anxiety disorder) Continue Cymbalta 30 mg daily.  Will reassess after sleep studies are completed.  Suspected sleep apnea Patient displays typical symptoms of sleep apnea.  Will refer for sleep studies.  Most of his symptoms could be attributed to OSA.  Will reevaluate after sleep studies are completed.  Smoker Smoking cessation advice given. Diet and nutrition also assessed. Patient physically active at work.  Manuel Lang was seen today for medication problem.  Diagnoses and all orders for this visit:  Suspected sleep apnea -     Ambulatory referral to Neurology  Colon cancer screening -     Ambulatory referral to Gastroenterology  Chronic sinusitis, unspecified location  Smoker  GAD (generalized anxiety disorder)    Patient Instructions   Health Maintenance, Male Adopting a healthy lifestyle and getting preventive care  are important in promoting health and wellness. Ask your health care provider about:  The right schedule for you to have regular tests and exams.  Things you can do on your own to prevent diseases and keep yourself healthy. What should I know about diet, weight, and exercise? Eat a healthy diet  Eat a diet that includes plenty of vegetables, fruits, low-fat dairy products, and lean protein.  Do not eat a lot of foods that are high in solid fats, added sugars, or sodium.   Maintain a healthy weight Body mass index (BMI) is a measurement that can be used to identify possible weight problems. It estimates body fat based on height and weight. Your health care provider can help determine your BMI and help you achieve or maintain a healthy weight. Get regular exercise Get regular exercise. This is one of the most important things you can do for your health. Most adults should:  Exercise for at least 150 minutes each week. The exercise should increase your heart rate and make you sweat (moderate-intensity exercise).  Do strengthening exercises at  least twice a week. This is in addition to the moderate-intensity exercise.  Spend less time sitting. Even light physical activity can be beneficial. Watch cholesterol and blood lipids Have your blood tested for lipids and cholesterol at 53 years of age, then have this test every 5 years. You may need to have your cholesterol levels checked more often if:  Your lipid or cholesterol levels are high.  You are older than 53 years of age.  You are at high risk for heart disease. What should I know about cancer screening? Many types of cancers can be detected early and may often be prevented. Depending on your health history and family history, you may need to have cancer screening at various ages. This may include screening for:  Colorectal cancer.  Prostate cancer.  Skin cancer.  Lung cancer. What should I know about heart disease, diabetes, and high blood pressure? Blood pressure and heart disease  High blood pressure causes heart disease and increases the risk of stroke. This is more likely to develop in people who have high blood pressure readings, are of African descent, or are overweight.  Talk with your health care provider about your target blood pressure readings.  Have your blood pressure checked: ? Every 3-5 years if you are 35-49 years of age. ? Every year if you are 53 years old or older.  If you are between the ages of 55 and 55 and are a current or former smoker, ask your health care provider if you should have a one-time screening for abdominal aortic aneurysm (AAA). Diabetes Have regular diabetes screenings. This checks your fasting blood sugar level. Have the screening done:  Once every three years after age 47 if you are at a normal weight and have a low risk for diabetes.  More often and at a younger age if you are overweight or have a high risk for diabetes. What should I know about preventing infection? Hepatitis B If you have a higher risk for hepatitis B,  you should be screened for this virus. Talk with your health care provider to find out if you are at risk for hepatitis B infection. Hepatitis C Blood testing is recommended for:  Everyone born from [birth year] through 1963.  Anyone with known risk factors for hepatitis C. Sexually transmitted infections (STIs)  You should be screened each year for STIs, including gonorrhea and chlamydia, if: ? You  are sexually active and are younger than 53 years of age. ? You are older than 53 years of age and your health care provider tells you that you are at risk for this type of infection. ? Your sexual activity has changed since you were last screened, and you are at increased risk for chlamydia or gonorrhea. Ask your health care provider if you are at risk.  Ask your health care provider about whether you are at high risk for HIV. Your health care provider may recommend a prescription medicine to help prevent HIV infection. If you choose to take medicine to prevent HIV, you should first get tested for HIV. You should then be tested every 3 months for as long as you are taking the medicine. Follow these instructions at home: Lifestyle  Do not use any products that contain nicotine or tobacco, such as cigarettes, e-cigarettes, and chewing tobacco. If you need help quitting, ask your health care provider.  Do not use street drugs.  Do not share needles.  Ask your health care provider for help if you need support or information about quitting drugs. Alcohol use  Do not drink alcohol if your health care provider tells you not to drink.  If you drink alcohol: ? Limit how much you have to 0-2 drinks a day. ? Be aware of how much alcohol is in your drink. In the [place] one drink equals one 12 oz bottle of beer (355 mL), one 5 oz glass of wine (148 mL), or one 1 oz glass of hard liquor (44 mL). General instructions  Schedule regular health, dental, and eye exams.  Stay current with your vaccines.  Tell  your health care provider if: ? You often feel depressed. ? You have ever been abused or do not feel safe at home. Summary  Adopting a healthy lifestyle and getting preventive care are important in promoting health and wellness.  Follow your health care provider's instructions about healthy diet, exercising, and getting tested or screened for diseases.  Follow your health care provider's instructions on monitoring your cholesterol and blood pressure. This information is not intended to replace advice given to you by your health care provider. Make sure you discuss any questions you have with your health care provider. Document Revised: 05/27/2018 Document Reviewed: 05/27/2018 Elsevier Patient Education  2021 [place], MD [facility]

## 2020-10-18 NOTE — Assessment & Plan Note (Signed)
Smoking cessation advice given. Diet and nutrition also assessed. Patient physically active at work.

## 2020-10-18 NOTE — Assessment & Plan Note (Signed)
Patient displays typical symptoms of sleep apnea.  Will refer for sleep studies.  Most of his symptoms could be attributed to OSA.  Will reevaluate after sleep studies are completed.

## 2020-10-18 NOTE — Assessment & Plan Note (Signed)
Exacerbated by smoking.  No seasonal component. Advised to use over-the-counter decongestants, antihistamines, and nasal sprays.  Sudafed, Zyrtec, and or Flonase.

## 2020-10-18 NOTE — Assessment & Plan Note (Signed)
Continue Cymbalta 30 mg daily.  Will reassess after sleep studies are completed.

## 2020-12-25 ENCOUNTER — Ambulatory Visit: Payer: Self-pay | Admitting: Emergency Medicine

## 2020-12-26 ENCOUNTER — Ambulatory Visit: Payer: PRIVATE HEALTH INSURANCE | Admitting: Neurology

## 2020-12-26 ENCOUNTER — Encounter: Payer: Self-pay | Admitting: Neurology

## 2020-12-26 VITALS — BP 129/84 | HR 73 | Ht 65.0 in | Wt 166.0 lb

## 2020-12-26 DIAGNOSIS — R0681 Apnea, not elsewhere classified: Secondary | ICD-10-CM

## 2020-12-26 DIAGNOSIS — R519 Headache, unspecified: Secondary | ICD-10-CM

## 2020-12-26 DIAGNOSIS — E663 Overweight: Secondary | ICD-10-CM | POA: Diagnosis not present

## 2020-12-26 DIAGNOSIS — R0683 Snoring: Secondary | ICD-10-CM

## 2020-12-26 DIAGNOSIS — Z820 Family history of epilepsy and other diseases of the nervous system: Secondary | ICD-10-CM

## 2020-12-26 DIAGNOSIS — G4719 Other hypersomnia: Secondary | ICD-10-CM

## 2020-12-26 NOTE — Patient Instructions (Signed)

## 2020-12-26 NOTE — Progress Notes (Signed)
Subjective:    Patient ID: Manuel Lang is a 55 y.o. male.  HPI    Star Age, MD, PhD [facility] [address] [place], [place] [address]  Dear Dr. Mitchel Honour,   I saw your patient, Manuel Lang, your kind request in my sleep clinic today for initial consultation of his sleep disorder, in particular, concern for underlying obstructive sleep apnea.  The patient is unaccompanied today.  As you know, Manuel Lang is a 55 year old right-handed gentleman with an underlying medical history of kidney stones, hyperlipidemia, smoking, anxiety, depression, vertigo/dizziness (for which he had seen my colleague Dr. Krista Blue in the past), and overweight state, who reports snoring and excessive daytime somnolence, nonrestorative sleep as well as apneic breathing pauses while asleep per wife's report.  I reviewed your office note from 10/18/2020.  His Epworth sleepiness score is 12 out of 24, fatigue severity score is 10 out of 63.  He does not wake up rested.  He has woken up from his own snoring and sometimes from coughing at night.  His mom has sleep apnea and has a CPAP machine.  He lives with his wife, his daughter and daughter's 2 young children.  He has 2 older sons.  He smokes about a pack per day and has not been able to quit smoking consistently.  He drinks caffeine in the form of [facility], about 5 20 ounce bottles per day on average.  He does not drink alcohol.  He has been on duloxetine for anxiety and depression, stable dose.  He does not have night to night nocturia.  Bedtime is generally between 9 PM and 9:30 PM and rise time around 5:45 AM.  He does not have a TV in his bedroom and does not have any pets in the house.  He has woken up rarely with a headache.  He does not currently take Xanax.  He works at a [employer].   His Past Medical History Is Significant For: Past Medical History:  Diagnosis Date   Anxiety    Depression    High cholesterol     Retinitis pigmentosa     Her Past Surgical History Is Significant For: Past Surgical History:  Procedure Laterality Date   HAND SURGERY  2010   Left    His Family History Is Significant For: Family History  Problem Relation Age of Onset   Diabetes Mother    High blood pressure Mother    Retinitis pigmentosa Mother    Sleep apnea Mother    High blood pressure Father    Retinitis pigmentosa Maternal Grandmother    Mental illness Paternal Aunt    High blood pressure Other    Diabetes Other        mother's side   Retinitis pigmentosa Other    Snoring Other    Retinitis pigmentosa Maternal Great-grandmother     His Social History Is Significant For: Social History   Socioeconomic History   Marital status: Married    Spouse name: [name]   Number of children: 3   Years of education: Not on file   Highest education level: 8th grade  Occupational History   Occupation: Production manager: [employer]  Tobacco Use   Smoking status: Every Day    Packs/day: 1.00    Pack years: 0.00    Types: Cigarettes   Smokeless tobacco: Never  Vaping Use   Vaping Use: Never used  Substance and Sexual Activity  Alcohol use: No   Drug use: No   Sexual activity: Not on file  Other Topics Concern   Not on file  Social History Narrative   Patient lives at home with his wife and children, 2 grandchildren.Patient works at [employer], full-time.Patient is married to [name].Patient drinks 4-20oz of [facility] per day. Patient is right-handed. Patient has 3 children.   Social Determinants of Health   Financial Resource Strain: Not on file  Food Insecurity: Not on file  Transportation Needs: Not on file  Physical Activity: Not on file  Stress: Not on file  Social Connections: Not on file    His Allergies Are:  No Known Allergies:   His Current Medications Are:  Outpatient Encounter Medications as of 12/26/2020  Medication Sig   diclofenac (VOLTAREN) 75 MG  EC tablet TAKE 1 TABLET BY MOUTH TWICE A DAY   DULoxetine (CYMBALTA) 30 MG capsule TAKE 1 CAPSULE BY MOUTH EVERY DAY   rosuvastatin (CRESTOR) 20 MG tablet Take 1 tablet (20 mg total) by mouth daily.   [DISCONTINUED] ALPRAZolam (XANAX) 0.25 MG tablet TAKE 1 TABLET (0.25 MG TOTAL) BY MOUTH EVERY 8 (EIGHT) HOURS AS NEEDED FOR ANXIETY. (Patient not taking: No sig reported)   No facility-administered encounter medications on file as of 12/26/2020.  :   Review of Systems:  Out of a complete 14 point review of systems, all are reviewed and negative with the exception of these symptoms as listed below:  Review of Systems  Neurological:        Patient here for sleep consult to be evaluated for sleep apnea. Patient reports sometimes he doesn't sleep well. His wife tells him that he stops breathing at night. He endorses snoring.   Epworth Sleepiness Scale 0= would never doze 1= slight chance of dozing 2= moderate chance of dozing 3= high chance of dozing  Sitting and reading: 2 Watching TV: 2 Sitting inactive in a public place (ex. Theater or meeting): 2 As a passenger in a car for an hour without a break: 2 Lying down to rest in the afternoon: 3 Sitting and talking to someone: 0 Sitting quietly after lunch (no alcohol): 1 In a car, while stopped in traffic: 0 Total: 12  FSS 10    Objective:  Neurological Exam  Physical Exam Physical Examination:   Vitals:   12/26/20 1531  BP: 129/84  Pulse: 73    General Examination: The patient is a very pleasant 55 y.o. male in no acute distress. He appears well-developed and well-nourished and well groomed.   HEENT: Normocephalic, atraumatic, pupils are equal, round and reactive to light, extraocular tracking is good without limitation to gaze excursion or nystagmus noted. Hearing is grossly intact. Face is symmetric with normal facial animation. Speech is clear with no dysarthria noted. There is no hypophonia. There is no lip, neck/head, jaw  or voice tremor. Neck is supple with full range of passive and active motion. There are no carotid bruits on auscultation. Oropharynx exam reveals: mild mouth dryness, poor dental hygiene on the bottom with few teeth left, full dentures on top, moderate airway crowding noted secondary to small airway entry, tonsillar size of about 1+, Mallampati class II.  Neck circumference of 15 three-quarter inches.  Tongue protrudes centrally and palate elevates symmetrically.   Chest: Clear to auscultation without wheezing, rhonchi or crackles noted.  Heart: S1+S2+0, regular and normal without murmurs, rubs or gallops noted.   Abdomen: Soft, non-tender and non-distended with normal bowel sounds appreciated  on auscultation.  Extremities: There is no pitting edema in the distal lower extremities bilaterally.   Skin: Warm and dry without trophic changes noted.   Musculoskeletal: exam reveals no obvious joint deformities, tenderness or joint swelling or erythema.   Neurologically:  Mental status: The patient is awake, alert and oriented in all 4 spheres. His immediate and remote memory, attention, language skills and fund of knowledge are appropriate. There is no evidence of aphasia, agnosia, apraxia or anomia. Speech is clear with normal prosody and enunciation. Thought process is linear. Mood is normal and affect is normal.  Cranial nerves II - XII are as described above under HEENT exam.  Motor exam: Normal bulk, strength and tone is noted. There is no tremor, Romberg is negative. Fine motor skills and coordination: grossly intact.  Cerebellar testing: No dysmetria or intention tremor. There is no truncal or gait ataxia.  Sensory exam: intact to light touch in the upper and lower extremities.  Gait, station and balance: He stands easily. No veering to one side is noted. No leaning to one side is noted. Posture is age-appropriate and stance is narrow based. Gait shows normal stride length and normal pace. No  problems turning are noted. Tandem walk is difficult for him. Assessment and Plan:  In summary, Smith Potenza is a very pleasant 55 y.o.-year old male with an underlying medical history of kidney stones, hyperlipidemia, smoking, anxiety, depression, vertigo/dizziness (for which he had seen my colleague Dr. Krista Blue in the past), and overweight state, whose history and physical exam are concerning for obstructive sleep apnea (OSA). I had a long chat with the patient about my findings and the diagnosis of OSA, its prognosis and treatment options. We talked about medical treatments, surgical interventions and non-pharmacological approaches. I explained in particular the risks and ramifications of untreated moderate to severe OSA, especially with respect to developing cardiovascular disease down the Road, including congestive heart failure, difficult to treat hypertension, cardiac arrhythmias, or stroke. Even type 2 diabetes has, in part, been linked to untreated OSA. Symptoms of untreated OSA include daytime sleepiness, memory problems, mood irritability and mood disorder such as depression and anxiety, lack of energy, as well as recurrent headaches, especially morning headaches. We talked about smoking cessation and trying to maintain a healthy lifestyle in general, as well as the importance of weight control. We also talked about the importance of good sleep hygiene. I recommended the following at this time: sleep study.  I explained the difference between a laboratory attended sleep study versus home sleep test to him. I explained the sleep test procedure to the patient and also outlined possible surgical and non-surgical treatment options of OSA, including the use of a custom-made dental device (which would require a referral to a specialist dentist or oral surgeon), upper airway surgical options, such as traditional UPPP or a novel less invasive surgical option in the form of Inspire hypoglossal nerve stimulation  (which would involve a referral to an ENT surgeon). I also explained the CPAP treatment option to the patient, who indicated that he would be willing to try CPAP if the need arises.  Realistically, a positive airway pressure device such as AutoPap or CPAP would be his most likely first-line treatment option.  We will pick up our discussion after testing. I answered all his questions today and the patient was in agreement. I plan to see him back after the sleep study is completed and encouraged him to call with any interim questions, concerns, problems or  updates.   Thank you very much for allowing me to participate in the care of this nice patient. If I can be of any further assistance to you please do not hesitate to call me at [phone].  Sincerely,   Star Age, MD, PhD

## 2021-01-13 ENCOUNTER — Other Ambulatory Visit: Payer: Self-pay | Admitting: Emergency Medicine

## 2021-01-13 DIAGNOSIS — E785 Hyperlipidemia, unspecified: Secondary | ICD-10-CM

## 2021-01-18 ENCOUNTER — Ambulatory Visit: Payer: PRIVATE HEALTH INSURANCE | Admitting: Emergency Medicine

## 2021-02-08 ENCOUNTER — Other Ambulatory Visit: Payer: Self-pay | Admitting: Emergency Medicine

## 2021-02-08 DIAGNOSIS — F411 Generalized anxiety disorder: Secondary | ICD-10-CM

## 2021-03-12 ENCOUNTER — Encounter: Payer: Self-pay | Admitting: Gastroenterology

## 2021-03-26 ENCOUNTER — Ambulatory Visit (AMBULATORY_SURGERY_CENTER): Payer: PRIVATE HEALTH INSURANCE | Admitting: *Deleted

## 2021-03-26 ENCOUNTER — Other Ambulatory Visit: Payer: Self-pay

## 2021-03-26 VITALS — Ht 66.0 in | Wt 170.0 lb

## 2021-03-26 DIAGNOSIS — Z1211 Encounter for screening for malignant neoplasm of colon: Secondary | ICD-10-CM

## 2021-03-26 MED ORDER — PEG 3350-KCL-NA BICARB-NACL 420 G PO SOLR: 4000.0000 mL | Freq: Once | ORAL | 0 refills | Status: AC

## 2021-03-26 NOTE — Progress Notes (Signed)
No egg or soy allergy known to patient  No issues known to pt with past sedation with any surgeries or procedures Patient denies ever being told they had issues or difficulty with intubation  No FH of Malignant Hyperthermia Pt is not on diet pills Pt is not on  home 02  Pt is not on blood thinners  Pt denies issues with constipation  No A fib or A flutter  Pt is fully vaccinated  for Covid   Due to the COVID-19 pandemic we are asking patients to follow certain guidelines in PV and the [place]   Pt aware of COVID protocols and [facility] guidelines   Pt verified name, DOB, address and insurance during PV today.  Pt mailed instruction packet of Emmi video, copy of consent form to read and not return, and instructions.   PV completed over the phone.  Pt encouraged to call with questions or issues.  My Chart instructions to pt as well

## 2021-04-04 ENCOUNTER — Encounter: Payer: Self-pay | Admitting: Gastroenterology

## 2021-04-09 ENCOUNTER — Encounter: Payer: Self-pay | Admitting: Gastroenterology

## 2021-04-09 ENCOUNTER — Ambulatory Visit (AMBULATORY_SURGERY_CENTER): Payer: PRIVATE HEALTH INSURANCE | Admitting: Gastroenterology

## 2021-04-09 VITALS — BP 117/61 | HR 81 | Temp 99.1°F | Resp 18 | Ht 66.0 in | Wt 176.0 lb

## 2021-04-09 DIAGNOSIS — D124 Benign neoplasm of descending colon: Secondary | ICD-10-CM

## 2021-04-09 DIAGNOSIS — Z1211 Encounter for screening for malignant neoplasm of colon: Secondary | ICD-10-CM

## 2021-04-09 DIAGNOSIS — D128 Benign neoplasm of rectum: Secondary | ICD-10-CM

## 2021-04-09 MED ORDER — SODIUM CHLORIDE 0.9 % IV SOLN: 500.0000 mL | Freq: Once | INTRAVENOUS | Status: DC

## 2021-04-09 NOTE — Progress Notes (Signed)
HPI: This is a man at routine risk for CRC   ROS: complete GI ROS as described in HPI, all other review negative.  Constitutional:  No unintentional weight loss   Past Medical History:  Diagnosis Date   Anxiety    Cataract    forming   Depression    High cholesterol    Retinitis pigmentosa     Past Surgical History:  Procedure Laterality Date   HAND SURGERY Left 06/17/2008   Left    Current Outpatient Medications  Medication Sig Dispense Refill   DULoxetine (CYMBALTA) 30 MG capsule TAKE 1 CAPSULE BY MOUTH EVERY DAY 90 capsule 3   rosuvastatin (CRESTOR) 20 MG tablet TAKE 1 TABLET BY MOUTH EVERY DAY 90 tablet 3   diclofenac (VOLTAREN) 75 MG EC tablet TAKE 1 TABLET BY MOUTH TWICE A DAY (Patient not taking: No sig reported) 90 tablet 0   Current Facility-Administered Medications  Medication Dose Route Frequency Provider Last Rate Last Admin   0.9 %  sodium chloride infusion  500 mL Intravenous Once Milus Banister, MD        Allergies as of 04/09/2021   (No Known Allergies)    Family History  Problem Relation Age of Onset   Diabetes Mother    High blood pressure Mother    Retinitis pigmentosa Mother    Sleep apnea Mother    High blood pressure Father    Mental illness Paternal Aunt    Retinitis pigmentosa Maternal Grandmother    Retinitis pigmentosa Maternal Great-grandmother    High blood pressure Other    Diabetes Other        mother's side   Retinitis pigmentosa Other    Snoring Other    Colon polyps Neg Hx    Colon cancer Neg Hx    Esophageal cancer Neg Hx    Stomach cancer Neg Hx    Rectal cancer Neg Hx     Social History   Socioeconomic History   Marital status: Married    Spouse name: [name]   Number of children: 3   Years of education: Not on file   Highest education level: 8th grade  Occupational History   Occupation: Production manager: [employer]  Tobacco Use   Smoking status: Every Day    Packs/day: 1.00    Types:  Cigarettes   Smokeless tobacco: Never  Vaping Use   Vaping Use: Never used  Substance and Sexual Activity   Alcohol use: No   Drug use: No   Sexual activity: Not on file  Other Topics Concern   Not on file  Social History Narrative   Patient lives at home with his wife and children, 2 grandchildren.Patient works at [employer], full-time.Patient is married to [place].Patient drinks 4-20oz of [facility] per day. Patient is right-handed. Patient has 3 children.   Social Determinants of Health   Financial Resource Strain: Not on file  Food Insecurity: Not on file  Transportation Needs: Not on file  Physical Activity: Not on file  Stress: Not on file  Social Connections: Not on file  Intimate Partner Violence: Not on file     Physical Exam: BP 132/77   Pulse 88   Temp 99.1 F (37.3 C) (Temporal)   Ht 5\' 6"  (1.676 m)   Wt 176 lb (79.8 kg)   SpO2 97%   BMI 28.41 kg/m  Constitutional: generally well-appearing Psychiatric: alert and oriented x3 Lungs: CTA bilaterally Heart: no MCR  Assessment  and plan: 55 y.o. male with routine risk for CTC  Colonsocopy today  Care is appropriate for the ambulatory setting.  Owens Loffler, MD [facility] 04/09/2021, 1:18 PM

## 2021-04-09 NOTE — Op Note (Signed)
[place] Patient Name: Manuel Lang Procedure Date: 04/09/2021 1:46 PM MRN: [id number] Endoscopist: Milus Banister , MD Age: 55 Referring MD:  Date of Birth: [birth year] Gender: Male Account #: [id number] Procedure:                Colonoscopy Indications:              Screening for colorectal malignant neoplasm Medicines:                Monitored Anesthesia Care Procedure:                Pre-Anesthesia Assessment:                           - Prior to the procedure, a History and Physical                            was performed, and patient medications and                            allergies were reviewed. The patient's tolerance of                            previous anesthesia was also reviewed. The risks                            and benefits of the procedure and the sedation                            options and risks were discussed with the patient.                            All questions were answered, and informed consent                            was obtained. Prior Anticoagulants: The patient has                            taken no previous anticoagulant or antiplatelet                            agents. ASA Grade Assessment: II - A patient with                            mild systemic disease. After reviewing the risks                            and benefits, the patient was deemed in                            satisfactory condition to undergo the procedure.                           After obtaining informed consent, the colonoscope  was passed under direct vision. Throughout the                            procedure, the patient's blood pressure, pulse, and                            oxygen saturations were monitored continuously. The                            Olympus CF-HQ190L [id number]) Colonoscope was                            introduced through the anus and advanced to the the                            cecum, identified by  appendiceal orifice and                            ileocecal valve. The colonoscopy was performed                            without difficulty. The patient tolerated the                            procedure well. The quality of the bowel                            preparation was good. The ileocecal valve,                            appendiceal orifice, and rectum were photographed. Scope In: 1:51:24 PM Scope Out: 2:03:09 PM Scope Withdrawal Time: 0 hours 9 minutes 23 seconds  Total Procedure Duration: 0 hours 11 minutes 45 seconds  Findings:                 Three sessile polyps were found in the rectum and                            descending colon. The polyps were 3 to 9 mm in                            size. These polyps were removed with a cold snare.                            Resection and retrieval were complete.                           Internal hemorrhoids were found. The hemorrhoids                            were small.                           The exam was otherwise without abnormality on  direct and retroflexion views. Complications:            No immediate complications. Estimated blood loss:                            None. Estimated Blood Loss:     Estimated blood loss: none. Impression:               - Three 3 to 9 mm polyps in the rectum and in the                            descending colon, removed with a cold snare.                            Resected and retrieved.                           - Internal hemorrhoids.                           - The examination was otherwise normal on direct                            and retroflexion views. Recommendation:           - Patient has a contact number available for                            emergencies. The signs and symptoms of potential                            delayed complications were discussed with the                            patient. Return to normal activities tomorrow.                             Written discharge instructions were provided to the                            patient.                           - Resume previous diet.                           - Continue present medications.                           - Await pathology results. Milus Banister, MD 04/09/2021 2:05:30 PM This report has been signed electronically.

## 2021-04-09 NOTE — Progress Notes (Signed)
To PACU, VSS. Report to [email or web address] 

## 2021-04-09 NOTE — Progress Notes (Signed)
Called to room to assist during endoscopic procedure.  Patient ID and intended procedure confirmed with present staff. Received instructions for my participation in the procedure from the performing physician.  

## 2021-04-09 NOTE — Patient Instructions (Signed)
Resume previous diet and current medications. Awaiting pathology results. Repeat Colonoscopy date to be determined based on pathology results.  YOU HAD AN ENDOSCOPIC PROCEDURE TODAY AT [facility]:   Refer to the procedure report that was given to you for any specific questions about what was found during the examination.  If the procedure report does not answer your questions, please call your gastroenterologist to clarify.  If you requested that your care partner not be given the details of your procedure findings, then the procedure report has been included in a sealed envelope for you to review at your convenience later.  YOU SHOULD EXPECT: Some feelings of bloating in the abdomen. Passage of more gas than usual.  Walking can help get rid of the air that was put into your GI tract during the procedure and reduce the bloating. If you had a lower endoscopy (such as a colonoscopy or flexible sigmoidoscopy) you may notice spotting of blood in your stool or on the toilet paper. If you underwent a bowel prep for your procedure, you may not have a normal bowel movement for a few days.  Please Note:  You might notice some irritation and congestion in your nose or some drainage.  This is from the oxygen used during your procedure.  There is no need for concern and it should clear up in a day or so.  SYMPTOMS TO REPORT IMMEDIATELY:  Following lower endoscopy (colonoscopy or flexible sigmoidoscopy):  Excessive amounts of blood in the stool  Significant tenderness or worsening of abdominal pains  Swelling of the abdomen that is new, acute  Fever of 100F or higher   For urgent or emergent issues, a gastroenterologist can be reached at any hour by calling [phone]. Do not use MyChart messaging for urgent concerns.    DIET:  We do recommend a small meal at first, but then you may proceed to your regular diet.  Drink plenty of fluids but you should avoid alcoholic beverages for 24  hours.  ACTIVITY:  You should plan to take it easy for the rest of today and you should NOT DRIVE or use heavy machinery until tomorrow (because of the sedation medicines used during the test).    FOLLOW UP: Our staff will call the number listed on your records 48-72 hours following your procedure to check on you and address any questions or concerns that you may have regarding the information given to you following your procedure. If we do not reach you, we will leave a message.  We will attempt to reach you two times.  During this call, we will ask if you have developed any symptoms of COVID 19. If you develop any symptoms (ie: fever, flu-like symptoms, shortness of breath, cough etc.) before then, please call [phone].  If you test positive for Covid 19 in the 2 weeks post procedure, please call and report this information to [place].    If any biopsies were taken you will be contacted by phone or by letter within the next 1-3 weeks.  Please call us at [phone] if you have not heard about the biopsies in 3 weeks.    SIGNATURES/CONFIDENTIALITY: You and/or your care partner have signed paperwork which will be entered into your electronic medical record.  These signatures attest to the fact that that the information above on your After Visit Summary has been reviewed and is understood.  Full responsibility of the confidentiality of this discharge information lies with you and/or your  care-partner.  

## 2021-04-09 NOTE — Progress Notes (Signed)
VS completed by DT.  HIPPA   Pt's states no medical or surgical changes since previsit or office visit.

## 2021-04-11 ENCOUNTER — Telehealth: Payer: Self-pay

## 2021-04-11 NOTE — Telephone Encounter (Signed)
Left message on answering machine. 

## 2021-04-13 ENCOUNTER — Encounter: Payer: Self-pay | Admitting: Gastroenterology

## 2022-01-08 ENCOUNTER — Other Ambulatory Visit: Payer: Self-pay | Admitting: Emergency Medicine

## 2022-01-08 DIAGNOSIS — E785 Hyperlipidemia, unspecified: Secondary | ICD-10-CM

## 2022-02-03 ENCOUNTER — Other Ambulatory Visit: Payer: Self-pay | Admitting: Emergency Medicine

## 2022-02-03 DIAGNOSIS — E785 Hyperlipidemia, unspecified: Secondary | ICD-10-CM

## 2022-02-26 ENCOUNTER — Other Ambulatory Visit: Payer: Self-pay | Admitting: Emergency Medicine

## 2022-02-26 DIAGNOSIS — E785 Hyperlipidemia, unspecified: Secondary | ICD-10-CM

## 2022-03-29 ENCOUNTER — Other Ambulatory Visit: Payer: Self-pay | Admitting: Emergency Medicine

## 2022-03-29 DIAGNOSIS — E785 Hyperlipidemia, unspecified: Secondary | ICD-10-CM

## 2022-09-17 ENCOUNTER — Ambulatory Visit (INDEPENDENT_AMBULATORY_CARE_PROVIDER_SITE_OTHER): Payer: PRIVATE HEALTH INSURANCE

## 2022-09-17 ENCOUNTER — Encounter: Payer: Self-pay | Admitting: Emergency Medicine

## 2022-09-17 ENCOUNTER — Ambulatory Visit (INDEPENDENT_AMBULATORY_CARE_PROVIDER_SITE_OTHER): Payer: PRIVATE HEALTH INSURANCE | Admitting: Emergency Medicine

## 2022-09-17 VITALS — BP 112/86 | HR 67 | Temp 98.2°F | Ht 66.0 in | Wt 170.4 lb

## 2022-09-17 DIAGNOSIS — G8929 Other chronic pain: Secondary | ICD-10-CM | POA: Diagnosis not present

## 2022-09-17 DIAGNOSIS — M25561 Pain in right knee: Secondary | ICD-10-CM

## 2022-09-17 DIAGNOSIS — Z13228 Encounter for screening for other metabolic disorders: Secondary | ICD-10-CM

## 2022-09-17 DIAGNOSIS — Z13220 Encounter for screening for lipoid disorders: Secondary | ICD-10-CM

## 2022-09-17 DIAGNOSIS — Z0001 Encounter for general adult medical examination with abnormal findings: Secondary | ICD-10-CM | POA: Diagnosis not present

## 2022-09-17 DIAGNOSIS — Z0000 Encounter for general adult medical examination without abnormal findings: Secondary | ICD-10-CM

## 2022-09-17 DIAGNOSIS — M25562 Pain in left knee: Secondary | ICD-10-CM | POA: Diagnosis not present

## 2022-09-17 DIAGNOSIS — Z130 Encounter for screening for diseases of the blood and blood-forming organs and certain disorders involving the immune mechanism: Secondary | ICD-10-CM

## 2022-09-17 DIAGNOSIS — Z1159 Encounter for screening for other viral diseases: Secondary | ICD-10-CM

## 2022-09-17 DIAGNOSIS — Z125 Encounter for screening for malignant neoplasm of prostate: Secondary | ICD-10-CM | POA: Diagnosis not present

## 2022-09-17 DIAGNOSIS — Z1329 Encounter for screening for other suspected endocrine disorder: Secondary | ICD-10-CM

## 2022-09-17 LAB — CBC WITH DIFFERENTIAL/PLATELET
Basophils Absolute: 0.1 10*3/uL (ref 0.0–0.1)
Basophils Relative: 0.6 % (ref 0.0–3.0)
Eosinophils Absolute: 0.5 10*3/uL (ref 0.0–0.7)
Eosinophils Relative: 3.4 % (ref 0.0–5.0)
HCT: 44.7 % (ref 39.0–52.0)
Hemoglobin: 14.7 g/dL (ref 13.0–17.0)
Lymphocytes Relative: 21.7 % (ref 12.0–46.0)
Lymphs Abs: 2.9 10*3/uL (ref 0.7–4.0)
MCHC: 32.9 g/dL (ref 30.0–36.0)
MCV: 90.4 fl (ref 78.0–100.0)
Monocytes Absolute: 0.8 10*3/uL (ref 0.1–1.0)
Monocytes Relative: 6.2 % (ref 3.0–12.0)
Neutro Abs: 9.2 10*3/uL — ABNORMAL HIGH (ref 1.4–7.7)
Neutrophils Relative %: 68.1 % (ref 43.0–77.0)
Platelets: 307 10*3/uL (ref 150.0–400.0)
RBC: 4.95 Mil/uL (ref 4.22–5.81)
RDW: 13.6 % (ref 11.5–15.5)
WBC: 13.5 10*3/uL — ABNORMAL HIGH (ref 4.0–10.5)

## 2022-09-17 LAB — PSA: PSA: 0.68 ng/mL (ref 0.10–4.00)

## 2022-09-17 LAB — HEMOGLOBIN A1C: Hgb A1c MFr Bld: 6.1 % (ref 4.6–6.5)

## 2022-09-17 NOTE — Patient Instructions (Signed)
Health Maintenance, Male Adopting a healthy lifestyle and getting preventive care are important in promoting health and wellness. Ask your health care provider about: The right schedule for you to have regular tests and exams. Things you can do on your own to prevent diseases and keep yourself healthy. What should I know about diet, weight, and exercise? Eat a healthy diet  Eat a diet that includes plenty of vegetables, fruits, low-fat dairy products, and lean protein. Do not eat a lot of foods that are high in solid fats, added sugars, or sodium. Maintain a healthy weight Body mass index (BMI) is a measurement that can be used to identify possible weight problems. It estimates body fat based on height and weight. Your health care provider can help determine your BMI and help you achieve or maintain a healthy weight. Get regular exercise Get regular exercise. This is one of the most important things you can do for your health. Most adults should: Exercise for at least 150 minutes each week. The exercise should increase your heart rate and make you sweat (moderate-intensity exercise). Do strengthening exercises at least twice a week. This is in addition to the moderate-intensity exercise. Spend less time sitting. Even light physical activity can be beneficial. Watch cholesterol and blood lipids Have your blood tested for lipids and cholesterol at 55 years of age, then have this test every 5 years. You may need to have your cholesterol levels checked more often if: Your lipid or cholesterol levels are high. You are older than 55 years of age. You are at high risk for heart disease. What should I know about cancer screening? Many types of cancers can be detected early and may often be prevented. Depending on your health history and family history, you may need to have cancer screening at various ages. This may include screening for: Colorectal cancer. Prostate cancer. Skin cancer. Lung  cancer. What should I know about heart disease, diabetes, and high blood pressure? Blood pressure and heart disease High blood pressure causes heart disease and increases the risk of stroke. This is more likely to develop in people who have high blood pressure readings or are overweight. Talk with your health care provider about your target blood pressure readings. Have your blood pressure checked: Every 3-5 years if you are 18-39 years of age. Every year if you are 40 years old or older. If you are between the ages of 65 and 75 and are a current or former smoker, ask your health care provider if you should have a one-time screening for abdominal aortic aneurysm (AAA). Diabetes Have regular diabetes screenings. This checks your fasting blood sugar level. Have the screening done: Once every three years after age 45 if you are at a normal weight and have a low risk for diabetes. More often and at a younger age if you are overweight or have a high risk for diabetes. What should I know about preventing infection? Hepatitis B If you have a higher risk for hepatitis B, you should be screened for this virus. Talk with your health care provider to find out if you are at risk for hepatitis B infection. Hepatitis C Blood testing is recommended for: Everyone born from 1945 through 1965. Anyone with known risk factors for hepatitis C. Sexually transmitted infections (STIs) You should be screened each year for STIs, including gonorrhea and chlamydia, if: You are sexually active and are younger than 55 years of age. You are older than 55 years of age and your   health care provider tells you that you are at risk for this type of infection. Your sexual activity has changed since you were last screened, and you are at increased risk for chlamydia or gonorrhea. Ask your health care provider if you are at risk. Ask your health care provider about whether you are at high risk for HIV. Your health care provider  may recommend a prescription medicine to help prevent HIV infection. If you choose to take medicine to prevent HIV, you should first get tested for HIV. You should then be tested every 3 months for as long as you are taking the medicine. Follow these instructions at home: Alcohol use Do not drink alcohol if your health care provider tells you not to drink. If you drink alcohol: Limit how much you have to 0-2 drinks a day. Know how much alcohol is in your drink. In the [place] one drink equals one 12 oz bottle of beer (355 mL), one 5 oz glass of wine (148 mL), or one 1 oz glass of hard liquor (44 mL). Lifestyle Do not use any products that contain nicotine or tobacco. These products include cigarettes, chewing tobacco, and vaping devices, such as e-cigarettes. If you need help quitting, ask your health care provider. Do not use street drugs. Do not share needles. Ask your health care provider for help if you need support or information about quitting drugs. General instructions Schedule regular health, dental, and eye exams. Stay current with your vaccines. Tell your health care provider if: You often feel depressed. You have ever been abused or do not feel safe at home. Summary Adopting a healthy lifestyle and getting preventive care are important in promoting health and wellness. Follow your health care provider's instructions about healthy diet, exercising, and getting tested or screened for diseases. Follow your health care provider's instructions on monitoring your cholesterol and blood pressure. This information is not intended to replace advice given to you by your health care provider. Make sure you discuss any questions you have with your health care provider. Document Revised: 10/23/2020 Document Reviewed: 10/23/2020 Elsevier Patient Education  2023 [employer]  

## 2022-09-17 NOTE — Progress Notes (Signed)
Manuel Lang 57 y.o.   Chief Complaint  Patient presents with   Annual Exam    Patient states he had eye surgery recently, patient states he is having issues with his vision in both eyes     HISTORY OF PRESENT ILLNESS: This is a 57 y.o. male here for annual exam Recently had cataract surgery in both eyes.  Complaining of occasional blurred vision.  Has history of retinitis pigmentosa. Still smoking. Physically active through work Complaining of bilateral knee pains  HPI   Prior to Admission medications   Medication Sig Start Date End Date Taking? Authorizing Provider  rosuvastatin (CRESTOR) 20 MG tablet TAKE 1 TABLET BY MOUTH EVERY DAY 03/30/22  Yes Alesandra Smart, Ines Bloomer, MD  diclofenac (VOLTAREN) 75 MG EC tablet TAKE 1 TABLET BY MOUTH TWICE A DAY Patient not taking: Reported on 03/26/2021 06/08/20   Horald Pollen, MD  DULoxetine (CYMBALTA) 30 MG capsule TAKE 1 CAPSULE BY MOUTH EVERY DAY Patient not taking: Reported on 09/17/2022 02/08/21   Horald Pollen, MD    No Known Allergies  Patient Active Problem List   Diagnosis Date Noted   Suspected sleep apnea 10/18/2020   Chronic sinusitis 10/18/2020   GAD (generalized anxiety disorder) 01/24/2018   Anxiety state 06/21/2016   Nephrolithiasis 11/09/2013   Smoker 11/09/2013   Depression 04/08/2013    Past Medical History:  Diagnosis Date   Anxiety    Cataract    forming   Depression    High cholesterol    Hypertension    Retinitis pigmentosa    Sleep apnea     Past Surgical History:  Procedure Laterality Date   EYE SURGERY     HAND SURGERY Left 06/17/2008   Left    Social History   Socioeconomic History   Marital status: Married    Spouse name: [name]   Number of children: 3   Years of education: Not on file   Highest education level: 8th grade  Occupational History   Occupation: Production manager: [employer]  Tobacco Use   Smoking status: Every Day    Packs/day: 1     Types: Cigarettes   Smokeless tobacco: Never  Vaping Use   Vaping Use: Never used  Substance and Sexual Activity   Alcohol use: No   Drug use: No   Sexual activity: Not on file  Other Topics Concern   Not on file  Social History Narrative   Patient lives at home with his wife and children, 2 grandchildren.Patient works at [employer], full-time.Patient is married to [place].Patient drinks 4-20oz of [facility] per day. Patient is right-handed. Patient has 3 children.   Social Determinants of Health   Financial Resource Strain: Not on file  Food Insecurity: Not on file  Transportation Needs: Not on file  Physical Activity: Not on file  Stress: Not on file  Social Connections: Not on file  Intimate Partner Violence: Not on file    Family History  Problem Relation Age of Onset   Diabetes Mother    High blood pressure Mother    Retinitis pigmentosa Mother    Sleep apnea Mother    High blood pressure Father    Mental illness Paternal Aunt    Retinitis pigmentosa Maternal Grandmother    Retinitis pigmentosa Maternal Great-grandmother    High blood pressure Other    Diabetes Other        mother's side   Retinitis pigmentosa Other    Snoring  Other    Diabetes Son    Colon polyps Neg Hx    Colon cancer Neg Hx    Esophageal cancer Neg Hx    Stomach cancer Neg Hx    Rectal cancer Neg Hx      Review of Systems  Constitutional: Negative.  Negative for chills and fever.  HENT: Negative.  Negative for congestion and sore throat.   Eyes:  Positive for blurred vision.  Respiratory: Negative.  Negative for cough and shortness of breath.   Cardiovascular: Negative.  Negative for chest pain and palpitations.  Gastrointestinal: Negative.  Negative for abdominal pain, blood in stool, diarrhea, melena, nausea and vomiting.  Musculoskeletal:  Positive for joint pain.  Skin: Negative.  Negative for rash.  Neurological: Negative.  Negative for dizziness and headaches.  All other  systems reviewed and are negative.   Vitals:   09/17/22 1514  BP: 112/86  Pulse: 67  Temp: 98.2 F (36.8 C)  SpO2: 96%    Physical Exam Vitals reviewed.  Constitutional:      Appearance: Normal appearance.  HENT:     Head: Normocephalic.     Right Ear: Tympanic membrane, ear canal and external ear normal.     Left Ear: Tympanic membrane, ear canal and external ear normal.     Mouth/Throat:     Mouth: Mucous membranes are moist.     Pharynx: Oropharynx is clear.  Eyes:     Extraocular Movements: Extraocular movements intact.     Conjunctiva/sclera: Conjunctivae normal.     Pupils: Pupils are equal, round, and reactive to light.  Cardiovascular:     Rate and Rhythm: Normal rate and regular rhythm.     Pulses: Normal pulses.     Heart sounds: Normal heart sounds.  Pulmonary:     Effort: Pulmonary effort is normal.     Breath sounds: Normal breath sounds.  Abdominal:     Palpations: Abdomen is soft.     Tenderness: There is no abdominal tenderness.  Musculoskeletal:     Cervical back: No tenderness.     Comments: Knees: No swelling or tenderness.  Full range of motion.  Some crepitation.  Stable in flexion and extension  Lymphadenopathy:     Cervical: No cervical adenopathy.  Skin:    General: Skin is warm and dry.     Capillary Refill: Capillary refill takes less than 2 seconds.  Neurological:     General: No focal deficit present.     Mental Status: He is alert and oriented to person, place, and time.  Psychiatric:        Mood and Affect: Mood normal.        Behavior: Behavior normal.    DG Knee Complete 4 Views Right  Result Date: 09/17/2022 CLINICAL DATA:  Bilateral chronic knee pain EXAM: RIGHT KNEE - COMPLETE 4 VIEW; LEFT KNEE - COMPLETE 4+ VIEW COMPARISON:  None Available. FINDINGS: No evidence of fracture, dislocation, or joint effusion. No evidence of arthropathy or other focal bone abnormality. Soft tissues are unremarkable. IMPRESSION: No acute osseous  abnormality. Electronically Signed   By: Jill Side M.D.   On: 09/17/2022 16:26   DG Knee Complete 4 Views Left  Result Date: 09/17/2022 CLINICAL DATA:  Bilateral chronic knee pain EXAM: RIGHT KNEE - COMPLETE 4 VIEW; LEFT KNEE - COMPLETE 4+ VIEW COMPARISON:  None Available. FINDINGS: No evidence of fracture, dislocation, or joint effusion. No evidence of arthropathy or other focal bone abnormality. Soft tissues are unremarkable.  IMPRESSION: No acute osseous abnormality. Electronically Signed   By: Jill Side M.D.   On: 09/17/2022 16:26     ASSESSMENT & PLAN: Problem List Items Addressed This Visit   None Visit Diagnoses     Routine general medical examination at a health care facility    -  Primary   Relevant Orders   CBC with Differential   Comprehensive metabolic panel   Hemoglobin A1c   Lipid panel   PSA(Must document that pt has been informed of limitations of PSA testing.)   Hepatitis C antibody screen   Bilateral chronic knee pain       Relevant Orders   DG Knee Complete 4 Views Right (Completed)   DG Knee Complete 4 Views Left (Completed)   Need for hepatitis C screening test       Relevant Orders   Hepatitis C antibody screen   Prostate cancer screening       Relevant Orders   PSA(Must document that pt has been informed of limitations of PSA testing.)   Screening for deficiency anemia       Relevant Orders   CBC with Differential   Screening for lipoid disorders       Relevant Orders   Lipid panel   Screening for endocrine, metabolic and immunity disorder       Relevant Orders   Comprehensive metabolic panel   Hemoglobin A1c      Modifiable risk factors discussed with patient. Anticipatory guidance according to age provided. The following topics were also discussed: Social Determinants of Health Smoking and cardiovascular/cancer risk associated with smoking.  Smoking cessation advice given. Diet and nutrition and need to decrease amount of daily carbohydrate  intake and daily calories and increase amount of plant-based protein in his diet Benefits of exercise Cancer screening and review of most recent colonoscopy report Vaccinations reviewed and recommendations Differential diagnosis of chronic bilateral knee pain and review of today's x-ray report Cardiovascular risk assessment and need for blood work Mental health including depression and anxiety Fall and accident prevention  Patient Instructions  Health Maintenance, Male Adopting a healthy lifestyle and getting preventive care are important in promoting health and wellness. Ask your health care provider about: The right schedule for you to have regular tests and exams. Things you can do on your own to prevent diseases and keep yourself healthy. What should I know about diet, weight, and exercise? Eat a healthy diet  Eat a diet that includes plenty of vegetables, fruits, low-fat dairy products, and lean protein. Do not eat a lot of foods that are high in solid fats, added sugars, or sodium. Maintain a healthy weight Body mass index (BMI) is a measurement that can be used to identify possible weight problems. It estimates body fat based on height and weight. Your health care provider can help determine your BMI and help you achieve or maintain a healthy weight. Get regular exercise Get regular exercise. This is one of the most important things you can do for your health. Most adults should: Exercise for at least 150 minutes each week. The exercise should increase your heart rate and make you sweat (moderate-intensity exercise). Do strengthening exercises at least twice a week. This is in addition to the moderate-intensity exercise. Spend less time sitting. Even light physical activity can be beneficial. Watch cholesterol and blood lipids Have your blood tested for lipids and cholesterol at 55 years of age, then have this test every 5 years. You may need to have  your cholesterol levels checked  more often if: Your lipid or cholesterol levels are high. You are older than 55 years of age. You are at high risk for heart disease. What should I know about cancer screening? Many types of cancers can be detected early and may often be prevented. Depending on your health history and family history, you may need to have cancer screening at various ages. This may include screening for: Colorectal cancer. Prostate cancer. Skin cancer. Lung cancer. What should I know about heart disease, diabetes, and high blood pressure? Blood pressure and heart disease High blood pressure causes heart disease and increases the risk of stroke. This is more likely to develop in people who have high blood pressure readings or are overweight. Talk with your health care provider about your target blood pressure readings. Have your blood pressure checked: Every 3-5 years if you are 95-70 years of age. Every year if you are 68 years old or older. If you are between the ages of 80 and 37 and are a current or former smoker, ask your health care provider if you should have a one-time screening for abdominal aortic aneurysm (AAA). Diabetes Have regular diabetes screenings. This checks your fasting blood sugar level. Have the screening done: Once every three years after age 65 if you are at a normal weight and have a low risk for diabetes. More often and at a younger age if you are overweight or have a high risk for diabetes. What should I know about preventing infection? Hepatitis B If you have a higher risk for hepatitis B, you should be screened for this virus. Talk with your health care provider to find out if you are at risk for hepatitis B infection. Hepatitis C Blood testing is recommended for: Everyone born from [birth year] through 1963. Anyone with known risk factors for hepatitis C. Sexually transmitted infections (STIs) You should be screened each year for STIs, including gonorrhea and chlamydia, if: You are  sexually active and are younger than 55 years of age. You are older than 55 years of age and your health care provider tells you that you are at risk for this type of infection. Your sexual activity has changed since you were last screened, and you are at increased risk for chlamydia or gonorrhea. Ask your health care provider if you are at risk. Ask your health care provider about whether you are at high risk for HIV. Your health care provider may recommend a prescription medicine to help prevent HIV infection. If you choose to take medicine to prevent HIV, you should first get tested for HIV. You should then be tested every 3 months for as long as you are taking the medicine. Follow these instructions at home: Alcohol use Do not drink alcohol if your health care provider tells you not to drink. If you drink alcohol: Limit how much you have to 0-2 drinks a day. Know how much alcohol is in your drink. In the [place] one drink equals one 12 oz bottle of beer (355 mL), one 5 oz glass of wine (148 mL), or one 1 oz glass of hard liquor (44 mL). Lifestyle Do not use any products that contain nicotine or tobacco. These products include cigarettes, chewing tobacco, and vaping devices, such as e-cigarettes. If you need help quitting, ask your health care provider. Do not use street drugs. Do not share needles. Ask your health care provider for help if you need support or information about quitting drugs. General instructions Schedule  regular health, dental, and eye exams. Stay current with your vaccines. Tell your health care provider if: You often feel depressed. You have ever been abused or do not feel safe at home. Summary Adopting a healthy lifestyle and getting preventive care are important in promoting health and wellness. Follow your health care provider's instructions about healthy diet, exercising, and getting tested or screened for diseases. Follow your health care provider's instructions on  monitoring your cholesterol and blood pressure. This information is not intended to replace advice given to you by your health care provider. Make sure you discuss any questions you have with your health care provider. Document Revised: 10/23/2020 Document Reviewed: 10/23/2020 Elsevier Patient Education  [place], MD [facility]

## 2022-09-18 LAB — COMPREHENSIVE METABOLIC PANEL
ALT: 10 U/L (ref 0–53)
AST: 14 U/L (ref 0–37)
Albumin: 4.2 g/dL (ref 3.5–5.2)
Alkaline Phosphatase: 67 U/L (ref 39–117)
BUN: 9 mg/dL (ref 6–23)
CO2: 28 mEq/L (ref 19–32)
Calcium: 9.3 mg/dL (ref 8.4–10.5)
Chloride: 105 mEq/L (ref 96–112)
Creatinine, Ser: 0.72 mg/dL (ref 0.40–1.50)
GFR: 103.55 mL/min (ref >60.00)
Glucose, Bld: 81 mg/dL (ref 70–99)
Potassium: 4.1 mEq/L (ref 3.5–5.1)
Sodium: 138 mEq/L (ref 135–145)
Total Bilirubin: 0.5 mg/dL (ref 0.2–1.2)
Total Protein: 7.1 g/dL (ref 6.0–8.3)

## 2022-09-18 LAB — LIPID PANEL
Cholesterol: 139 mg/dL (ref 0–200)
HDL: 28.1 mg/dL — ABNORMAL LOW (ref >39.00)
NonHDL: 110.94
Total CHOL/HDL Ratio: 5
Triglycerides: 226 mg/dL — ABNORMAL HIGH (ref 0.0–149.0)
VLDL: 45.2 mg/dL — ABNORMAL HIGH (ref 0.0–40.0)

## 2022-09-18 LAB — HEPATITIS C ANTIBODY: Hepatitis C Ab: NONREACTIVE

## 2022-09-18 LAB — LDL CHOLESTEROL, DIRECT: Direct LDL: 76 mg/dL

## 2022-09-23 ENCOUNTER — Other Ambulatory Visit: Payer: Self-pay | Admitting: Emergency Medicine

## 2022-09-23 DIAGNOSIS — E785 Hyperlipidemia, unspecified: Secondary | ICD-10-CM

## 2023-03-19 ENCOUNTER — Other Ambulatory Visit: Payer: Self-pay | Admitting: Emergency Medicine

## 2023-03-19 DIAGNOSIS — E785 Hyperlipidemia, unspecified: Secondary | ICD-10-CM
